# Patient Record
Sex: Female | Born: 1970 | Race: White | Hispanic: No | Marital: Married | Smoking: Never smoker
Health system: Southern US, Community
[De-identification: ages and names within clinical notes are randomized; demographics above are authoritative.]

## PROBLEM LIST (undated history)

## (undated) DIAGNOSIS — Z8489 Family history of other specified conditions: Secondary | ICD-10-CM

## (undated) DIAGNOSIS — C649 Malignant neoplasm of unspecified kidney, except renal pelvis: Secondary | ICD-10-CM

## (undated) DIAGNOSIS — D649 Anemia, unspecified: Secondary | ICD-10-CM

## (undated) DIAGNOSIS — E039 Hypothyroidism, unspecified: Secondary | ICD-10-CM

## (undated) DIAGNOSIS — J45909 Unspecified asthma, uncomplicated: Secondary | ICD-10-CM

## (undated) DIAGNOSIS — K219 Gastro-esophageal reflux disease without esophagitis: Secondary | ICD-10-CM

## (undated) DIAGNOSIS — E282 Polycystic ovarian syndrome: Secondary | ICD-10-CM

## (undated) HISTORY — PX: ABDOMINAL HYSTERECTOMY: SHX81

## (undated) HISTORY — PX: ELBOW SURGERY: SHX618

## (undated) HISTORY — PX: APPENDECTOMY: SHX54

## (undated) HISTORY — PX: CHOLECYSTECTOMY: SHX55

## (undated) HISTORY — PX: HEMORRHOID SURGERY: SHX153

## (undated) HISTORY — PX: KIDNEY SURGERY: SHX687

---

## 2004-04-26 ENCOUNTER — Observation Stay (HOSPITAL_COMMUNITY): Admission: RE | Admit: 2004-04-26 | Discharge: 2004-04-27 | Payer: Self-pay | Admitting: Obstetrics & Gynecology

## 2008-01-29 ENCOUNTER — Ambulatory Visit (HOSPITAL_COMMUNITY): Admission: RE | Admit: 2008-01-29 | Discharge: 2008-01-29 | Payer: Self-pay | Admitting: Preventative Medicine

## 2008-11-19 ENCOUNTER — Encounter: Admission: RE | Admit: 2008-11-19 | Discharge: 2008-11-19 | Payer: Self-pay | Admitting: Unknown Physician Specialty

## 2010-06-16 NOTE — Op Note (Signed)
[name], [name]                  ACCOUNT NO.:  [id number]   MEDICAL RECORD NO.:  [id number]          PATIENT TYPE:  AMB   LOCATION:  DAY                           FACILITY:  [facility]   PHYSICIAN:  Lazaro Arms, M.D.   DATE OF BIRTH:  27-Aug-1970   DATE OF PROCEDURE:  04/26/2004  DATE OF DISCHARGE:                                 OPERATIVE REPORT   PREOPERATIVE DIAGNOSES:  1.  Menometrorrhagia.  2.  Dysmenorrhea.  3.  Dyspareunia.   POSTOPERATIVE DIAGNOSES:  1.  Menometrorrhagia.  2.  Dysmenorrhea.  3.  Dyspareunia.   PROCEDURE:  Vaginal hysterectomy.   SURGEON:  Lazaro Arms, M.D.   ANESTHESIA:  General endotracheal.   FINDINGS:  The patient had what appeared to be a normal uterus.  Her tubes  and ovaries were also normal.  My suspicion was that she had adenomyosis.   DESCRIPTION OF OPERATION:  The patient was taken to the operating room and  placed in the supine position where she underwent general endotracheal  anesthesia.  She was then placed in dorsolithotomy position and prepped and  draped in the usual sterile fashion.  Marcaine 0.25% with 1:200,000  epinephrine was injected about the cervix.  Circumferential incision made  and the vagina was pushed off of the anterior and posterior ligament without  difficulty.  The uterosacral ligaments were clamped, cut and suture ligated.  The cardinal ligaments were clamped, cut and suture ligated.  The cardinal  ligaments were clamped, cut and suture ligated.  The anterior peritoneum was  entered.  The uterine vessels were clamped, cut and transfixion suture  ligated.  The serial pedicles were taken out the fundus of the uterus, each  pedicle being clamped, cut and suture ligated.  The utero-ovarian ligaments  were cross clamped.  A fore and aft suture was placed.  There was good  hemostasis of all pedicles.  The peritoneal cavity was  irrigated.  The ovaries were normal.  The perineum was closed in a  pursestring fashion.   The vagina was closed in interrupted fashion from side  to side.  The patient tolerated the procedure well.  She experienced 50 mL  of blood loss.  Taken to the recovery room in good stable condition.  All  counts were correct.      LHE/MEDQ  D:  04/26/2004  T:  04/26/2004  Job:  [id number]

## 2010-06-16 NOTE — H&P (Signed)
[name], [name]                  ACCOUNT NO.:  [id number]   MEDICAL RECORD NO.:  [id number]          PATIENT TYPE:  AMB   LOCATION:  DAY                           FACILITY:  [facility]   PHYSICIAN:  Lazaro Arms, M.D.   DATE OF BIRTH:  April 16, 1970   DATE OF ADMISSION:  DATE OF DISCHARGE:  LH                                HISTORY & PHYSICAL   HISTORY OF PRESENT ILLNESS:  Olshefski is a 40 year old white female gravida 1,  para 1.  She had a baby about 12 years ago, who has been seen in our office  now for several visits since last August complaining of very heavy and very  painful menstrual periods.  Patient was placed on Ovcon 35, Advil and Aleve  prophylactically, which really has not improved.  Additionally, she is  having pain with intercourse with deep thrusting.  I saw her originally on  January 26, 2004.  We did an ultrasound, which essentially revealed a  normal uterus and probably maybe some adenomyosis.  She did have pain with  palpation of the uterus.  At that time, I placed her on Megace, which has  stopped her bleeding.  I saw her back on March 03, 2004, after we had  talked significantly.  The patient decided that she wanted to proceed with  definitive therapy, prompted by pain with intercourse.  As a result, she is  admitted for a vaginal hysterectomy.   PAST MEDICAL HISTORY:  Negative.   PAST SURGICAL HISTORY:  She had a right ovarian cyst removed in 2000.   She is allergic to ASPIRIN.   PAST OB HISTORY:  She is gravida 1, para 1 with one vaginal delivery.   REVIEW OF SYSTEMS:  Otherwise negative.   PHYSICAL EXAMINATION:  VITAL SIGNS:  Her weight is 143 pounds.  Blood  pressure 100/72.  HEENT:  Unremarkable.  NECK:  Thyroid is normal.  LUNGS:  Clear.  HEART:  Regular rate and rhythm without murmurs, rubs or gallops.  BREASTS:  Without masses, discharge, skin changes.  ABDOMEN:  Benign.  No hepatosplenomegaly or mass.  GENITOURINARY:  She has normal external  genitalia.  The vagina is pink and  moist without discharge.  The cervix is parous, normal. The uterus is normal  in size, shape, and contour.  The ovaries are normal.   IMPRESSION:  1.  Menometrorrhagia.  2.  Dysmenorrhea.  3.  Dyspareunia.   PLAN:  Patient is admitted for vaginal hysterectomy.  She understands the  risks, benefits, indications, and alternatives for the procedure.      LHE/MEDQ  D:  04/25/2004  T:  04/25/2004  Job:  [id number]

## 2015-06-14 ENCOUNTER — Emergency Department (HOSPITAL_BASED_OUTPATIENT_CLINIC_OR_DEPARTMENT_OTHER): Payer: Self-pay

## 2015-06-14 ENCOUNTER — Encounter (HOSPITAL_BASED_OUTPATIENT_CLINIC_OR_DEPARTMENT_OTHER): Payer: Self-pay | Admitting: *Deleted

## 2015-06-14 ENCOUNTER — Emergency Department (HOSPITAL_BASED_OUTPATIENT_CLINIC_OR_DEPARTMENT_OTHER)
Admission: EM | Admit: 2015-06-14 | Discharge: 2015-06-15 | Disposition: A | Discharge status: Home / Self Care | Payer: Self-pay | Attending: Emergency Medicine | Admitting: Emergency Medicine

## 2015-06-14 DIAGNOSIS — Z85528 Personal history of other malignant neoplasm of kidney: Secondary | ICD-10-CM | POA: Insufficient documentation

## 2015-06-14 DIAGNOSIS — R110 Nausea: Secondary | ICD-10-CM | POA: Insufficient documentation

## 2015-06-14 DIAGNOSIS — M549 Dorsalgia, unspecified: Secondary | ICD-10-CM | POA: Insufficient documentation

## 2015-06-14 DIAGNOSIS — Y939 Activity, unspecified: Secondary | ICD-10-CM | POA: Insufficient documentation

## 2015-06-14 DIAGNOSIS — W19XXXA Unspecified fall, initial encounter: Secondary | ICD-10-CM

## 2015-06-14 DIAGNOSIS — S8012XA Contusion of left lower leg, initial encounter: Secondary | ICD-10-CM | POA: Insufficient documentation

## 2015-06-14 DIAGNOSIS — M7918 Myalgia, other site: Secondary | ICD-10-CM

## 2015-06-14 DIAGNOSIS — Y929 Unspecified place or not applicable: Secondary | ICD-10-CM | POA: Insufficient documentation

## 2015-06-14 DIAGNOSIS — W109XXA Fall (on) (from) unspecified stairs and steps, initial encounter: Secondary | ICD-10-CM | POA: Insufficient documentation

## 2015-06-14 DIAGNOSIS — Y999 Unspecified external cause status: Secondary | ICD-10-CM | POA: Insufficient documentation

## 2015-06-14 DIAGNOSIS — Z79899 Other long term (current) drug therapy: Secondary | ICD-10-CM | POA: Insufficient documentation

## 2015-06-14 DIAGNOSIS — S50311A Abrasion of right elbow, initial encounter: Secondary | ICD-10-CM | POA: Insufficient documentation

## 2015-06-14 HISTORY — DX: Malignant neoplasm of unspecified kidney, except renal pelvis: C64.9

## 2015-06-14 HISTORY — DX: Gastro-esophageal reflux disease without esophagitis: K21.9

## 2015-06-14 MED ORDER — IBUPROFEN 800 MG PO TABS
800.0000 mg | ORAL_TABLET | Freq: Once | ORAL | Status: AC
  Administered 2015-06-14: 800 mg via ORAL
  Filled 2015-06-14: qty 1

## 2015-06-14 MED ORDER — OXYCODONE HCL 5 MG PO TABS: 5.0000 mg | ORAL_TABLET | Freq: Four times a day (QID) | ORAL | Status: DC | PRN

## 2015-06-14 NOTE — ED Notes (Addendum)
Pt c/o fall up stairs x 1 hr ago injuring bil legs and right shoulder ETOH

## 2015-06-14 NOTE — ED Provider Notes (Signed)
CSN: [id number]     Arrival date & time 06/14/15  2155 History   First MD Initiated Contact with Patient 06/14/15 2206     Chief Complaint  Patient presents with  . Fall   (Consider location/radiation/quality/duration/timing/severity/associated sxs/prior Treatment) HPI 46 y.o. female presents to the Emergency Department today after a mechanical fall x 1 hour ago. Notes ambulating up the stairs and missing a step. Pt lost her footing and fell forward while sliding down 16 steps. No head trauma/LOC as patient was covering her head as she slid down. Noted BLE swelling over tibias with ecchymosis. Abrasion over right elbow. Notes pain is 8/10. Throbbing. Pt is able to ambulate, but with difficulty. Notes ETOH use at the time. No N/V. No chest pain. No shortness of breath. No numbness/tingling.   Past Medical History  Diagnosis Date  . GERD (gastroesophageal reflux disease)   . Renal cell cancer [facility])    Past Surgical History  Procedure Laterality Date  . Abdominal hysterectomy    . Appendectomy    . Cholecystectomy    . Kidney surgery     History reviewed. No pertinent family history. Social History  Substance Use Topics  . Smoking status: Never Smoker   . Smokeless tobacco: None  . Alcohol Use: No   OB History    No data available     Review of Systems  Cardiovascular: Negative for chest pain.  Gastrointestinal: Positive for nausea. Negative for vomiting.  Musculoskeletal: Positive for myalgias, back pain, joint swelling, arthralgias and neck pain.   Allergies  Review of patient's allergies indicates no known allergies.  Home Medications   Prior to Admission medications   Medication Sig Start Date End Date Taking? Authorizing Provider  omeprazole (PRILOSEC) 40 MG capsule Take 40 mg by mouth daily.   Yes Historical Provider, MD   BP 129/84 mmHg  Pulse 93  Temp(Src) 98.3 F (36.8 C)  Resp 18  Ht 5\' 5"  (1.651 m)  Wt 86.183 kg  BMI 31.62 kg/m2  SpO2 97%   Physical  Exam  Constitutional: She is oriented to person, place, and time. She appears well-developed and well-nourished.  HENT:  Head: Normocephalic and atraumatic.  Eyes: EOM are normal. Pupils are equal, round, and reactive to light.  Neck: Trachea normal, normal range of motion and phonation normal. Neck supple. Normal carotid pulses present. Spinous process tenderness (C7) present. No muscular tenderness present. Carotid bruit is not present. No rigidity. No erythema and normal range of motion present.  Cardiovascular: Normal rate, regular rhythm and normal heart sounds.   Pulmonary/Chest: Effort normal and breath sounds normal.  Abdominal: Soft.  Musculoskeletal:       Right shoulder: She exhibits tenderness and pain. She exhibits normal range of motion, no swelling and normal pulse.       Left shoulder: Normal.       Right elbow: She exhibits decreased range of motion. She exhibits no swelling and no deformity. Tenderness found. Medial epicondyle and lateral epicondyle tenderness noted.       Left elbow: Normal.       Right wrist: Normal.       Left wrist: Normal.       Right hip: Normal.       Left hip: Normal.       Right knee: Normal.       Left knee: Normal.       Right ankle: Normal.       Left ankle: Normal.  Cervical back: She exhibits tenderness, bony tenderness and pain. She exhibits normal range of motion, no swelling, no edema, no deformity and no laceration.       Thoracic back: Normal.       Lumbar back: Normal.  BLE swelling and ecchymosis over proximal tibias. Neurovascularly intact x 4 extremities. No pelvic instability. C spine around C7 TTP. Pt able to ambulate.    Neurological: She is alert and oriented to person, place, and time. She has normal strength. No cranial nerve deficit or sensory deficit.  Cranial Nerves:  II: Pupils equal, round, reactive to light III,IV, VI: ptosis not present, extra-ocular motions intact bilaterally  V,VII: smile symmetric, facial  light touch sensation equal VIII: hearing grossly normal bilaterally  IX,X: midline uvula rise  XI: bilateral shoulder shrug equal and strong XII: midline tongue extension  Skin: Skin is warm and dry.  Psychiatric: She has a normal mood and affect. Her behavior is normal. Thought content normal.  Nursing note and vitals reviewed.  ED Course  Procedures (including critical care time) Labs Review Labs Reviewed - No data to display  Imaging Review No results found. I have personally reviewed and evaluated these images and lab results as part of my medical decision-making.   EKG Interpretation None      MDM  I have reviewed and evaluated the relevant imaging studies.  I have reviewed the relevant previous healthcare records.I obtained HPI from historian.  ED Course:  Assessment: Pt is a 45yF who presents with s/p mechanical fall from stairs. On exam, pt in NAD. Nontoxic/nonseptic appearing. VSS. Afebrile. Lungs CTA. Heart RRR. Abdomen nontender soft. Noted ecchymosis and swelling over BLE proximal tibias. Compartments soft. Neurovascularly intact. Abrasion noted on posterior right elbow. ROM intact. All other extremities unremarkable. Pelvis stable. No chest tenderness. No head trauma or LOC. Noted C spine tenderness. Imaging with no acute abnormalities. Given analgesia in ED. Plan is to [place] with follow up to PCP. Musculoskeletal pain associated after fall. At time of discharge, Patient is in no acute distress. Vital Signs are stable. Patient is able to ambulate. Patient able to tolerate PO.    Disposition/Plan:  DC Home Additional Verbal discharge instructions given and discussed with patient.  Pt Instructed to f/u with PCP in the next week for evaluation and treatment of symptoms. Return precautions given Pt acknowledges and agrees with plan  Supervising Physician Blanchie Dessert, MD   Final diagnoses:  Fall  Musculoskeletal pain     Shary Decamp, PA-C 06/22/15  2306  Blanchie Dessert, MD 06/23/15 [phone]

## 2015-06-14 NOTE — Discharge Instructions (Signed)
Please read and follow all provided instructions.  Your diagnoses today include:  1. Musculoskeletal pain   2. Fall    Tests performed today include:  Vital signs. See below for your results today.   Medications prescribed:   None  Home care instructions:  Follow any educational materials contained in this packet.  You can use Ibuprofen 400mg  combined with Tylenol 1000mg  for pain relief every 6 hours. Do not exceed 4g of Tylenol in one 24 hour period.   Follow-up instructions: Please follow-up with your primary care provider in the next 48 hours for further evaluation of symptoms and treatment   Return instructions:   Please return to the Emergency Department if you do not get better, if you get worse, or new symptoms OR  - Fever (temperature greater than 101.38F)  - Bleeding that does not stop with holding pressure to the area    -Severe pain (please note that you may be more sore the day after your accident)  - Chest Pain  - Difficulty breathing  - Severe nausea or vomiting  - Inability to tolerate food and liquids  - Passing out  - Skin becoming red around your wounds  - Change in mental status (confusion or lethargy)  - New numbness or weakness     Please return if you have any other emergent concerns.  Additional Information:  Your vital signs today were: BP 129/84 mmHg   Pulse 93   Temp(Src) 98.3 F (36.8 C)   Resp 18   Ht 5\' 5"  (1.651 m)   Wt 86.183 kg   BMI 31.62 kg/m2   SpO2 97% If your blood pressure (BP) was elevated above 135/85 this visit, please have this repeated by your doctor within one month. ---------------

## 2015-06-14 NOTE — ED Notes (Signed)
Patient transported to X-ray 

## 2015-06-15 MED ORDER — OXYCODONE HCL 5 MG PO TABS
ORAL_TABLET | ORAL | Status: AC
  Filled 2015-06-15: qty 1

## 2015-06-15 MED ORDER — ONDANSETRON 4 MG PO TBDP
ORAL_TABLET | ORAL | Status: AC
  Filled 2015-06-15: qty 1

## 2015-06-19 ENCOUNTER — Emergency Department (HOSPITAL_BASED_OUTPATIENT_CLINIC_OR_DEPARTMENT_OTHER): Payer: Self-pay

## 2015-06-19 ENCOUNTER — Emergency Department (HOSPITAL_BASED_OUTPATIENT_CLINIC_OR_DEPARTMENT_OTHER)
Admission: EM | Admit: 2015-06-19 | Discharge: 2015-06-19 | Disposition: A | Discharge status: Home / Self Care | Payer: Self-pay | Attending: Emergency Medicine | Admitting: Emergency Medicine

## 2015-06-19 ENCOUNTER — Encounter (HOSPITAL_BASED_OUTPATIENT_CLINIC_OR_DEPARTMENT_OTHER): Payer: Self-pay

## 2015-06-19 DIAGNOSIS — R1011 Right upper quadrant pain: Secondary | ICD-10-CM | POA: Insufficient documentation

## 2015-06-19 DIAGNOSIS — S9002XA Contusion of left ankle, initial encounter: Secondary | ICD-10-CM | POA: Insufficient documentation

## 2015-06-19 DIAGNOSIS — M25571 Pain in right ankle and joints of right foot: Secondary | ICD-10-CM

## 2015-06-19 DIAGNOSIS — R52 Pain, unspecified: Secondary | ICD-10-CM

## 2015-06-19 DIAGNOSIS — R0781 Pleurodynia: Secondary | ICD-10-CM | POA: Insufficient documentation

## 2015-06-19 DIAGNOSIS — S9001XA Contusion of right ankle, initial encounter: Secondary | ICD-10-CM | POA: Insufficient documentation

## 2015-06-19 DIAGNOSIS — Z90710 Acquired absence of both cervix and uterus: Secondary | ICD-10-CM | POA: Insufficient documentation

## 2015-06-19 DIAGNOSIS — M25511 Pain in right shoulder: Secondary | ICD-10-CM | POA: Insufficient documentation

## 2015-06-19 DIAGNOSIS — R112 Nausea with vomiting, unspecified: Secondary | ICD-10-CM | POA: Insufficient documentation

## 2015-06-19 DIAGNOSIS — R51 Headache: Secondary | ICD-10-CM | POA: Insufficient documentation

## 2015-06-19 DIAGNOSIS — Z85528 Personal history of other malignant neoplasm of kidney: Secondary | ICD-10-CM | POA: Insufficient documentation

## 2015-06-19 DIAGNOSIS — M25572 Pain in left ankle and joints of left foot: Secondary | ICD-10-CM

## 2015-06-19 DIAGNOSIS — Y9301 Activity, walking, marching and hiking: Secondary | ICD-10-CM | POA: Insufficient documentation

## 2015-06-19 DIAGNOSIS — W109XXA Fall (on) (from) unspecified stairs and steps, initial encounter: Secondary | ICD-10-CM | POA: Insufficient documentation

## 2015-06-19 DIAGNOSIS — Y999 Unspecified external cause status: Secondary | ICD-10-CM | POA: Insufficient documentation

## 2015-06-19 DIAGNOSIS — Y929 Unspecified place or not applicable: Secondary | ICD-10-CM | POA: Insufficient documentation

## 2015-06-19 LAB — LIPASE, BLOOD: Lipase: 21 U/L (ref 11–51)

## 2015-06-19 LAB — COMPREHENSIVE METABOLIC PANEL
ALT: 18 U/L (ref 14–54)
AST: 23 U/L (ref 15–41)
Albumin: 4.4 g/dL (ref 3.5–5.0)
Alkaline Phosphatase: 82 U/L (ref 38–126)
Anion gap: 7 (ref 5–15)
BUN: 15 mg/dL (ref 6–20)
CO2: 26 mmol/L (ref 22–32)
Calcium: 9.6 mg/dL (ref 8.9–10.3)
Chloride: 104 mmol/L (ref 101–111)
Creatinine, Ser: 0.66 mg/dL (ref 0.44–1.00)
GFR calc Af Amer: >60 mL/min (ref >60)
GFR calc non Af Amer: >60 mL/min (ref >60)
Glucose, Bld: 99 mg/dL (ref 65–99)
Potassium: 4.2 mmol/L (ref 3.5–5.1)
Sodium: 137 mmol/L (ref 135–145)
Total Bilirubin: 0.9 mg/dL (ref 0.3–1.2)
Total Protein: 7.5 g/dL (ref 6.5–8.1)

## 2015-06-19 LAB — CK: Total CK: 59 U/L (ref 38–234)

## 2015-06-19 LAB — CBC WITH DIFFERENTIAL/PLATELET
Basophils Absolute: 0.1 10*3/uL (ref 0.0–0.1)
Basophils Relative: 1 %
Eosinophils Absolute: 0.1 10*3/uL (ref 0.0–0.7)
Eosinophils Relative: 2 %
HCT: 38.7 % (ref 36.0–46.0)
Hemoglobin: 13.2 g/dL (ref 12.0–15.0)
Lymphocytes Relative: 28 %
Lymphs Abs: 2.1 10*3/uL (ref 0.7–4.0)
MCH: 33.7 pg (ref 26.0–34.0)
MCHC: 34.1 g/dL (ref 30.0–36.0)
MCV: 98.7 fL (ref 78.0–100.0)
Monocytes Absolute: 0.5 10*3/uL (ref 0.1–1.0)
Monocytes Relative: 7 %
Neutro Abs: 4.5 10*3/uL (ref 1.7–7.7)
Neutrophils Relative %: 62 %
Platelets: 288 10*3/uL (ref 150–400)
RBC: 3.92 MIL/uL (ref 3.87–5.11)
RDW: 11.9 % (ref 11.5–15.5)
WBC: 7.4 10*3/uL (ref 4.0–10.5)

## 2015-06-19 LAB — URINALYSIS, ROUTINE W REFLEX MICROSCOPIC
BILIRUBIN URINE: NEGATIVE
GLUCOSE, UA: NEGATIVE mg/dL
HGB URINE DIPSTICK: NEGATIVE
KETONES UR: NEGATIVE mg/dL
LEUKOCYTES UA: NEGATIVE
Nitrite: NEGATIVE
PH: 7 (ref 5.0–8.0)
PROTEIN: NEGATIVE mg/dL
Specific Gravity, Urine: 1.008 (ref 1.005–1.030)

## 2015-06-19 LAB — PREGNANCY, URINE: Preg Test, Ur: NEGATIVE

## 2015-06-19 MED ORDER — IOPAMIDOL (ISOVUE-300) INJECTION 61%
80.0000 mL | Freq: Once | INTRAVENOUS | Status: AC | PRN
  Administered 2015-06-19: 80 mL via INTRAVENOUS

## 2015-06-19 MED ORDER — SODIUM CHLORIDE 0.9 % IV BOLUS (SEPSIS)
1000.0000 mL | Freq: Once | INTRAVENOUS | Status: AC
  Administered 2015-06-19: 1000 mL via INTRAVENOUS

## 2015-06-19 MED ORDER — ORPHENADRINE CITRATE ER 100 MG PO TB12: 100.0000 mg | ORAL_TABLET | Freq: Two times a day (BID) | ORAL | Status: DC

## 2015-06-19 MED ORDER — MORPHINE SULFATE (PF) 4 MG/ML IV SOLN
4.0000 mg | Freq: Once | INTRAVENOUS | Status: AC
  Administered 2015-06-19: 4 mg via INTRAVENOUS
  Filled 2015-06-19: qty 1

## 2015-06-19 MED ORDER — ONDANSETRON HCL 4 MG/2ML IJ SOLN
4.0000 mg | Freq: Once | INTRAMUSCULAR | Status: AC
  Administered 2015-06-19: 4 mg via INTRAVENOUS
  Filled 2015-06-19: qty 2

## 2015-06-19 MED ORDER — NAPROXEN 375 MG PO TABS: 375.0000 mg | ORAL_TABLET | Freq: Two times a day (BID) | ORAL | Status: DC

## 2015-06-19 MED ORDER — ONDANSETRON HCL 4 MG PO TABS: 4.0000 mg | ORAL_TABLET | Freq: Four times a day (QID) | ORAL | Status: DC

## 2015-06-19 NOTE — Discharge Instructions (Signed)
Medications: Naprosyn, Norflex, Zofran  Treatment: Take Naprosyn twice daily for your pain and inflammation. Take Norflex twice daily for your muscle soreness and pain. Take Zofran every 6 hours as needed for nausea and vomiting. Ice the areas of bruising and soreness 3-4 times daily alternating 20 minutes on, 20 minutes off.  Follow-up: Please follow-up with Dr. Debroah Loop office for further evaluation of shoulder pain. Please follow-up with [facility] for further evaluation of abdominal pain and nausea. Please follow-up with your primary care provider this week for follow-up of today's visit. Please return to emergency department if you develop any new or worsening symptoms.   Abdominal Pain, Adult Many things can cause abdominal pain. Usually, abdominal pain is not caused by a disease and will improve without treatment. It can often be observed and treated at home. Your health care provider will do a physical exam and possibly order blood tests and X-rays to help determine the seriousness of your pain. However, in many cases, more time must pass before a clear cause of the pain can be found. Before that point, your health care provider may not know if you need more testing or further treatment. HOME CARE INSTRUCTIONS Monitor your abdominal pain for any changes. The following actions may help to alleviate any discomfort you are experiencing:  Only take over-the-counter or prescription medicines as directed by your health care provider.  Do not take laxatives unless directed to do so by your health care provider.  Try a clear liquid diet (broth, tea, or water) as directed by your health care provider. Slowly move to a bland diet as tolerated. SEEK MEDICAL CARE IF:  You have unexplained abdominal pain.  You have abdominal pain associated with nausea or diarrhea.  You have pain when you urinate or have a bowel movement.  You experience abdominal pain that wakes you in the  night.  You have abdominal pain that is worsened or improved by eating food.  You have abdominal pain that is worsened with eating fatty foods.  You have a fever. SEEK IMMEDIATE MEDICAL CARE IF:  Your pain does not go away within 2 hours.  You keep throwing up (vomiting).  Your pain is felt only in portions of the abdomen, such as the right side or the left lower portion of the abdomen.  You pass bloody or black tarry stools. MAKE SURE YOU:  Understand these instructions.  Will watch your condition.  Will get help right away if you are not doing well or get worse.   This information is not intended to replace advice given to you by your health care provider. Make sure you discuss any questions you have with your health care provider.   Document Released: 10/25/2004 Document Revised: 10/06/2014 Document Reviewed: 09/24/2012 Elsevier Interactive Patient Education 2016 [employer]  Shoulder Pain The shoulder is the joint that connects your arms to your body. The bones that form the shoulder joint include the upper arm bone (humerus), the shoulder blade (scapula), and the collarbone (clavicle). The top of the humerus is shaped like a ball and fits into a rather flat socket on the scapula (glenoid cavity). A combination of muscles and strong, fibrous tissues that connect muscles to bones (tendons) support your shoulder joint and hold the ball in the socket. Small, fluid-filled sacs (bursae) are located in different areas of the joint. They act as cushions between the bones and the overlying soft tissues and help reduce friction between the gliding tendons and the bone as you  move your arm. Your shoulder joint allows a wide range of motion in your arm. This range of motion allows you to do things like scratch your back or throw a ball. However, this range of motion also makes your shoulder more prone to pain from overuse and injury. Causes of shoulder pain can originate from both injury  and overuse and usually can be grouped in the following four categories:  Redness, swelling, and pain (inflammation) of the tendon (tendinitis) or the bursae (bursitis).  Instability, such as a dislocation of the joint.  Inflammation of the joint (arthritis).  Broken bone (fracture). HOME CARE INSTRUCTIONS   Apply ice to the sore area.  Put ice in a plastic bag.  Place a towel between your skin and the bag.  Leave the ice on for 15-20 minutes, 3-4 times per day for the first 2 days, or as directed by your health care provider.  Stop using cold packs if they do not help with the pain.  If you have a shoulder sling or immobilizer, wear it as long as your caregiver instructs. Only remove it to shower or bathe. Move your arm as little as possible, but keep your hand moving to prevent swelling.  Squeeze a soft ball or foam pad as much as possible to help prevent swelling.  Only take over-the-counter or prescription medicines for pain, discomfort, or fever as directed by your caregiver. SEEK MEDICAL CARE IF:   Your shoulder pain increases, or new pain develops in your arm, hand, or fingers.  Your hand or fingers become cold and numb.  Your pain is not relieved with medicines. SEEK IMMEDIATE MEDICAL CARE IF:   Your arm, hand, or fingers are numb or tingling.  Your arm, hand, or fingers are significantly swollen or turn white or blue. MAKE SURE YOU:   Understand these instructions.  Will watch your condition.  Will get help right away if you are not doing well or get worse.   This information is not intended to replace advice given to you by your health care provider. Make sure you discuss any questions you have with your health care provider.   Document Released: 10/25/2004 Document Revised: 02/05/2014 Document Reviewed: 05/10/2014 Elsevier Interactive Patient Education [employer].

## 2015-06-19 NOTE — ED Provider Notes (Signed)
CSN: [id number]     Arrival date & time 06/19/15  1504 History  By signing my name below, I, Stephania Fragmin, attest that this documentation has been prepared under the direction and in the presence of Eliezer Mccoy, PA-C. Electronically Signed: Stephania Fragmin, ED Scribe. 06/19/2015. 5:16 PM.    Chief Complaint  Patient presents with  . Abdominal Pain   The history is provided by the patient. No language interpreter was used.    HPI Comments: [name] is a 45 y.o. female with a history of GERD and renal cell cancer, who presents to the Emergency Department s/p a fall that occurred 5 days ago, complaining of gradually worsening, constant, dull, stabbing, 8/10 right-sided abdominal pain that gradually developed days after her fall. Patient states she was walking upstairs while wearing slippers when she slipped and fell backwards, first landing on her right shoulder, and then tumbling down 16 steps. She states she is unsure whether she struck her head or lost consciousness but reports she tried to "tuck-and-roll" when she realized she was falling. Her husband states he ran to check on her and found her curled in a ball. Patient was seen in the ED the same day, and her chief complaint initially was bilateral leg pain. She had x-rays of her right elbow, bilateral tibia/fibula, and neck; all of which were negative, and was discharged with oxycodone. She notes an initial generalized headache that was alleviated with Aleve.   She states she was initially feeling sore all over and was not particularly concerned about her abdomen, but she states the abdominal pain that developed over the past few days feels different in quality from her initial soreness. She rates her pain as 8/10 presently. She also complains of right rib pain beneath her right breast that is worse with breathing, right arm pain radiating to her shoulder blade, nausea with 1 episode of vomiting while in the waiting room today, a dull headache located  in her bilateral temples, and continuing bilateral anterior lower leg and ankle pain. She also notes her fingernails have appeared more yellow than usual. Patient reports she had taken Aleve and Motrin, muscle relaxants, and oxycodone; all with partial relief to her pain. She states she has been icing her legs. Patient notes a history of abdominal hysterectomy, cholecystectomy, appendectomy, and partial nephrectomy. She denies dysuria.   Past Medical History  Diagnosis Date  . GERD (gastroesophageal reflux disease)   . Renal cell cancer [facility])    Past Surgical History  Procedure Laterality Date  . Abdominal hysterectomy    . Appendectomy    . Cholecystectomy    . Kidney surgery     No family history on file. Social History  Substance Use Topics  . Smoking status: Never Smoker   . Smokeless tobacco: None  . Alcohol Use: No   OB History    No data available     Review of Systems  Constitutional: Negative for fever and chills.  HENT: Negative for facial swelling and sore throat.   Respiratory: Negative for shortness of breath.   Cardiovascular: Positive for chest pain (right-sided rib pain).  Gastrointestinal: Positive for nausea, vomiting (one episode) and abdominal pain. Negative for diarrhea.  Genitourinary: Negative for dysuria.  Musculoskeletal: Positive for myalgias. Negative for back pain.  Skin: Negative for rash and wound.  Neurological: Positive for headaches.  Psychiatric/Behavioral: The patient is not nervous/anxious.   All other systems reviewed and are negative.  Allergies  Review of patient's allergies  indicates no known allergies.  Home Medications   Prior to Admission medications   Medication Sig Start Date End Date Taking? Authorizing Provider  naproxen (NAPROSYN) 375 MG tablet Take 1 tablet (375 mg total) by mouth 2 (two) times daily. 06/19/15   Frederica Kuster, PA-C  omeprazole (PRILOSEC) 40 MG capsule Take 40 mg by mouth daily.    Historical Provider, MD   ondansetron (ZOFRAN) 4 MG tablet Take 1 tablet (4 mg total) by mouth every 6 (six) hours. 06/19/15   Frederica Kuster, PA-C  orphenadrine (NORFLEX) 100 MG tablet Take 1 tablet (100 mg total) by mouth 2 (two) times daily. 06/19/15   Frederica Kuster, PA-C  oxyCODONE (ROXICODONE) 5 MG immediate release tablet Take 1 tablet (5 mg total) by mouth every 6 (six) hours as needed for severe pain. 06/14/15   Shary Decamp, PA-C   BP 123/81 mmHg  Pulse 79  Temp(Src) 98.3 F (36.8 C) (Oral)  Resp 16  Ht 5\' 5"  (1.651 m)  Wt 86.183 kg  BMI 31.62 kg/m2  SpO2 97% Physical Exam  Constitutional: She appears well-developed and well-nourished. No distress.  HENT:  Head: Normocephalic and atraumatic.  Mouth/Throat: Oropharynx is clear and moist. No oropharyngeal exudate.  Eyes: Conjunctivae are normal. Pupils are equal, round, and reactive to light. Right eye exhibits no discharge. Left eye exhibits no discharge. No scleral icterus.  Neck: Normal range of motion. Neck supple. No thyromegaly present.  Cardiovascular: Normal rate, regular rhythm, normal heart sounds and intact distal pulses.  Exam reveals no gallop and no friction rub.   No murmur heard. Pulmonary/Chest: Effort normal and breath sounds normal. No stridor. No respiratory distress. She has no wheezes. She has no rales.    Abdominal: Soft. Bowel sounds are normal. She exhibits no distension. There is tenderness in the right upper quadrant and epigastric area. There is no rebound and no guarding.    Musculoskeletal: She exhibits no edema.       Right shoulder: She exhibits tenderness, bony tenderness and pain. She exhibits normal range of motion and no deformity.       Right ankle: She exhibits ecchymosis. She exhibits normal range of motion.       Left ankle: She exhibits ecchymosis. She exhibits normal range of motion. Tenderness.       Cervical back: She exhibits no bony tenderness.       Thoracic back: She exhibits no bony tenderness.        Lumbar back: She exhibits no bony tenderness.       Arms:      Legs:      Feet:  Lymphadenopathy:    She has no cervical adenopathy.  Neurological: She is alert. Coordination normal.  CN 3-12 intact; normal sensation throughout; 5/5 strength in all 4 extremities; equal bilateral grip strength  Skin: Skin is warm and dry. No rash noted. She is not diaphoretic. No pallor.  Yellowing noted to bilateral finger nails, patient states this is new since yesterday  Psychiatric: She has a normal mood and affect.  Nursing note and vitals reviewed.   ED Course  Procedures (including critical care time)  DIAGNOSTIC STUDIES: Oxygen Saturation is 98% on RA, normal by my interpretation.    COORDINATION OF CARE: 5:11 PM - Discussed treatment plan with pt at bedside which includes diagnostic testing. Pt verbalized understanding and agreed to plan.   Labs Review Labs Reviewed  URINALYSIS, ROUTINE W REFLEX MICROSCOPIC (NOT AT [facility])  PREGNANCY, URINE  COMPREHENSIVE METABOLIC PANEL  CBC WITH DIFFERENTIAL/PLATELET  LIPASE, BLOOD  CK    Imaging Review Dg Chest 2 View  06/19/2015  CLINICAL DATA:  45 year old female with progressive right-sided chest and abdominal pain after falling down the stairs 5 days previously EXAM: CHEST  2 VIEW COMPARISON:  Concurrently obtained right-sided rib series FINDINGS: The lungs are clear and negative for focal airspace consolidation, pulmonary edema or suspicious pulmonary nodule. No pleural effusion or pneumothorax. Cardiac and mediastinal contours are within normal limits. No acute fracture or lytic or blastic osseous lesions. The visualized upper abdominal bowel gas pattern is unremarkable. IMPRESSION: Negative chest x-ray. Electronically Signed   By: Jacqulynn Cadet M.D.   On: 06/19/2015 19:50   Dg Ribs Unilateral Right  06/19/2015  CLINICAL DATA:  45 year old female with progressive right-sided rib and abdominal pain after falling down stairs 5 days previously  EXAM: RIGHT RIBS - 2 VIEW COMPARISON:  Concurrently obtained chest x-ray FINDINGS: No fracture or other bone lesions are seen involving the ribs. Mild dextro convex scoliosis of the lower thoracic spine centered at T10-T11. Surgical clips in a right upper quadrant suggest prior cholecystectomy. IMPRESSION: 1. No evidence of acute rib fracture. 2. Mild dextro convex scoliosis centered at T10-T11. Electronically Signed   By: Jacqulynn Cadet M.D.   On: 06/19/2015 19:51   Dg Ankle Complete Left  06/19/2015  CLINICAL DATA:  Pain after fall 5 days ago. EXAM: LEFT ANKLE COMPLETE - 3+ VIEW COMPARISON:  None. FINDINGS: There is no evidence of fracture, dislocation, or joint effusion. There is no evidence of arthropathy or other focal bone abnormality. Soft tissues are unremarkable. IMPRESSION: Negative. Electronically Signed   By: Dorise Bullion III M.D   On: 06/19/2015 18:27   Dg Ankle Complete Right  06/19/2015  CLINICAL DATA:  45 year old who fell down approximately 16 stairs 5 days ago, persistent bilateral ankle pain. Subsequent encounter. EXAM: RIGHT ANKLE - COMPLETE 3+ VIEW COMPARISON:  Right tibia-fibula x-rays 06/14/2015. FINDINGS: No evidence of acute fracture. Ankle mortise intact with well-preserved joint space. Well-preserved bone mineral density. No intrinsic osseous abnormalities. No visible joint effusion. IMPRESSION: Normal examination. Electronically Signed   By: Evangeline Dakin M.D.   On: 06/19/2015 18:28   Ct Head Wo Contrast  06/19/2015  CLINICAL DATA:  Status post fall down 16 steps 5 days ago, with loss of consciousness. Nausea, vomiting and headache. Initial encounter. EXAM: CT HEAD WITHOUT CONTRAST TECHNIQUE: Contiguous axial images were obtained from the base of the skull through the vertex without intravenous contrast. COMPARISON:  CT of the head and MRI of the brain performed 01/29/2008 FINDINGS: There is no evidence of acute infarction, mass lesion, or intra- or extra-axial  hemorrhage on CT. The posterior fossa, including the cerebellum, brainstem and fourth ventricle, is within normal limits. The third and lateral ventricles, and basal ganglia are unremarkable in appearance. The cerebral hemispheres are symmetric in appearance, with normal gray-white differentiation. No mass effect or midline shift is seen. There is no evidence of fracture; visualized osseous structures are unremarkable in appearance. The orbits are within normal limits. The paranasal sinuses and mastoid air cells are well-aerated. No significant soft tissue abnormalities are seen. IMPRESSION: No evidence of traumatic intracranial injury or fracture. Electronically Signed   By: Garald Balding M.D.   On: 06/19/2015 18:31   Ct Abdomen Pelvis W Contrast  06/19/2015  CLINICAL DATA:  Status post fall down stairs, with right-sided abdominal pain. Nausea and vomiting. Initial encounter. EXAM: CT ABDOMEN AND  PELVIS WITH CONTRAST TECHNIQUE: Multidetector CT imaging of the abdomen and pelvis was performed using the standard protocol following bolus administration of intravenous contrast. CONTRAST:  75mL ISOVUE-300 IOPAMIDOL (ISOVUE-300) INJECTION 61% COMPARISON:  Abdominal radiograph from 11/19/2008 FINDINGS: The visualized lung bases are clear. No free air or free fluid is seen within the abdomen or pelvis. There is no evidence of solid or hollow organ injury. The liver and spleen are unremarkable in appearance. The patient is status post cholecystectomy, with clips noted at the gallbladder fossa. The pancreas and adrenal glands are unremarkable. Postoperative change is noted at the right kidney, with scattered parenchymal calcification. A few small right renal stones are seen, measuring up to 4 mm in size. The left kidney is unremarkable in appearance. There is no evidence of hydronephrosis. No ureteral stones are seen. No perinephric stranding is appreciated. The small bowel is unremarkable in appearance. The stomach is  within normal limits. No acute vascular abnormalities are seen. The patient is status post appendectomy. The colon is unremarkable in appearance. The bladder is mildly distended and grossly unremarkable. The patient is status post hysterectomy. No suspicious adnexal masses are seen. No inguinal lymphadenopathy is seen. No acute osseous abnormalities are identified. IMPRESSION: 1. No evidence of traumatic injury to the abdomen or pelvis. 2. Postoperative change at the right kidney. Few small nonobstructing right renal stones seen, measuring up to 4 mm in size. Electronically Signed   By: Garald Balding M.D.   On: 06/19/2015 18:35   I have personally reviewed and evaluated these images and lab results as part of my medical decision-making.   EKG Interpretation None      MDM   Patient pain and nausea medication ED. CBC, CMP, lipase, CK, UA all within normal limits. Negative chest x-ray. No evidence of acute right rib fracture. Negative bilateral ankle x-rays. CT head shows no evidence of traumatic intracranial injury or fracture. CT abdomen and pelvis with contrast shows no evidence of traumatic injury to the abdomen or pelvis. Patient to follow-up with GI and orthopedics for further evaluation. Patient discharged with naproxen, Norflex, and Zofran. Patient also evaluated by Dr. Vallery Ridge who is in agreement with plan. Patient vitals stable throughout ED course and discharged in satisfactory condition. Patient able to ambulate in ED.  Final diagnoses:  Right shoulder pain  Right upper quadrant abdominal pain  Arthralgia of both ankles  Non-intractable vomiting with nausea, vomiting of unspecified type    I personally performed the services described in this documentation, which was scribed in my presence. The recorded information has been reviewed and is accurate.     Frederica Kuster, PA-C 06/20/15 0149  Charlesetta Shanks, MD 06/23/15 [phone]

## 2015-06-19 NOTE — ED Notes (Signed)
Pt reports fall on Tuesday. Reports increased pain in right upper quadrant and right shoulder pain. Seen at [facility] center and sent for ?possible internal bleeding.

## 2018-01-01 IMAGING — CR DG RIBS 2V*R*
3 series · 3 of 3 positions shown · non-contrast
Comparison: Concurrently obtained chest x-ray

CLINICAL DATA: 45-year-old female with progressive right-sided rib
and abdominal pain after falling down stairs 5 days previously

EXAM:
RIGHT RIBS - 2 VIEW

[w ribs ap/pa upper right]
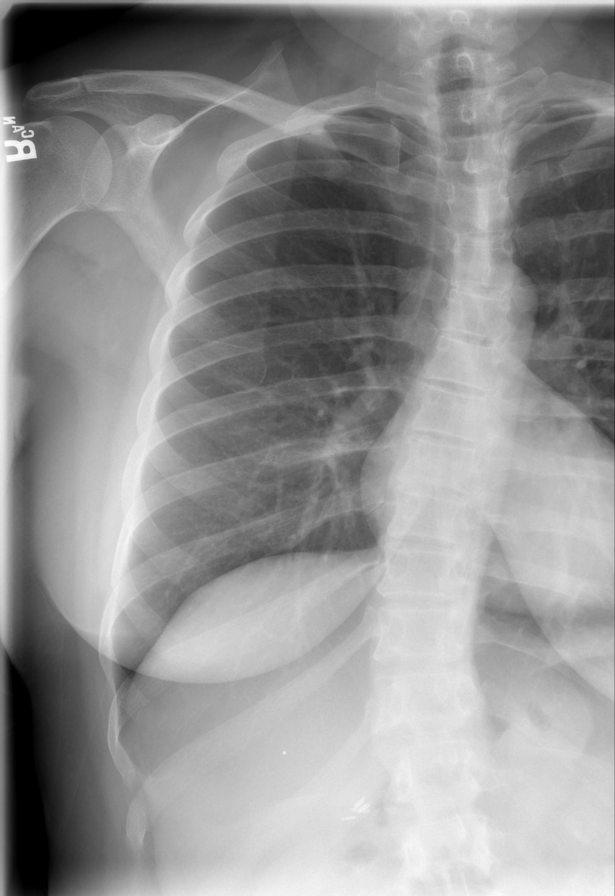

[w ribs oblique right]
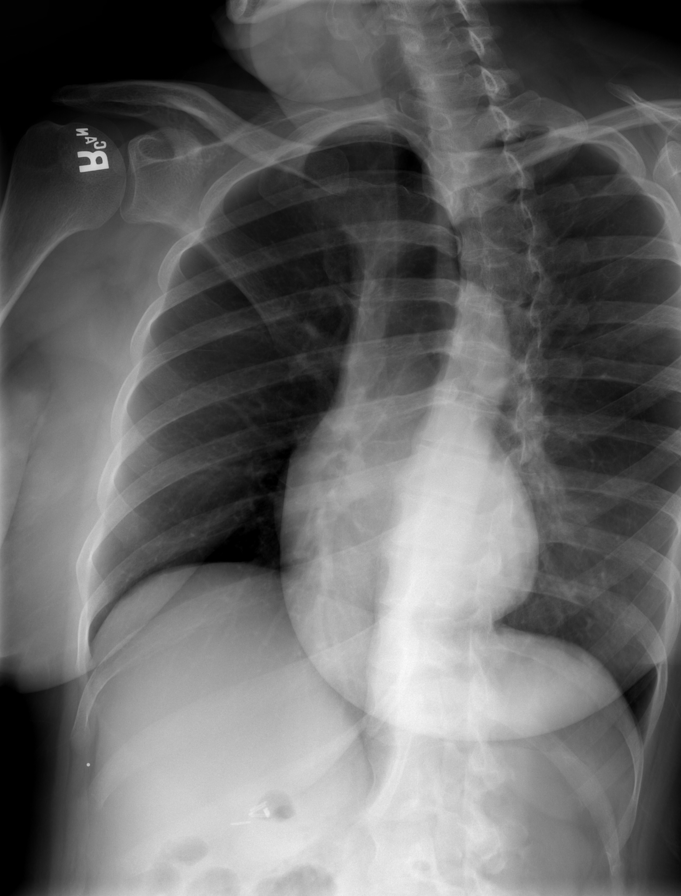

[w ribs ap/pa lower right]
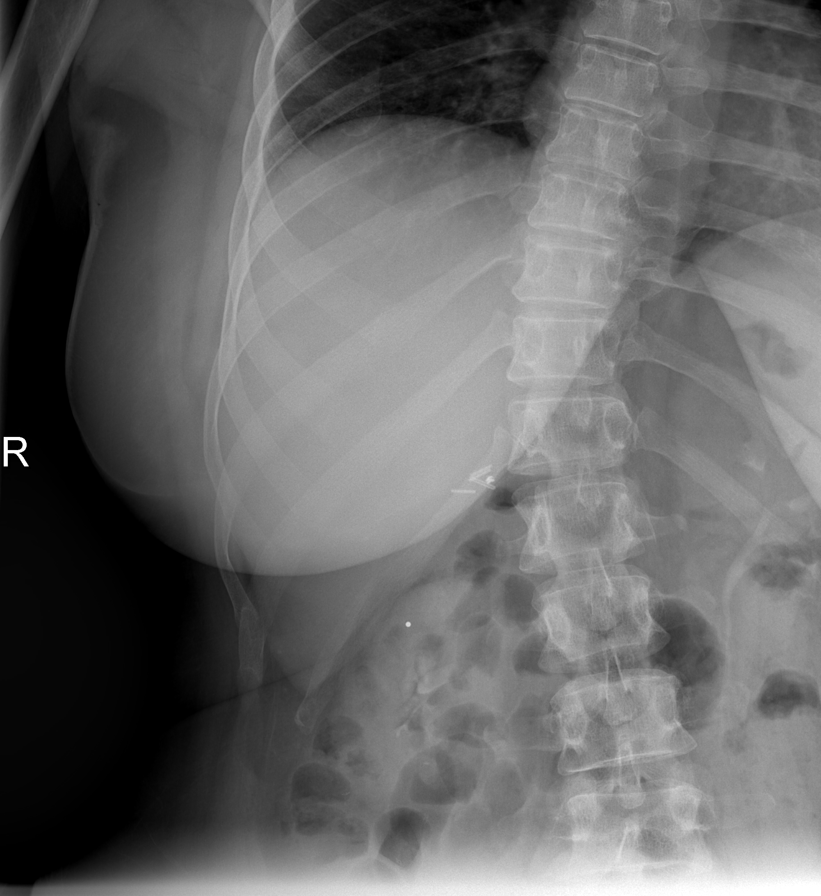

[3 of 3 positions shown; findings below may reference images not displayed]

FINDINGS: No fracture or other bone lesions are seen involving the ribs. Mild
dextro convex scoliosis of the lower thoracic spine centered at
T10-T11. Surgical clips in a right upper quadrant suggest prior
cholecystectomy.
IMPRESSION: 1. No evidence of acute rib fracture.
2. Mild dextro convex scoliosis centered at T10-T11.

## 2019-06-11 ENCOUNTER — Encounter (HOSPITAL_BASED_OUTPATIENT_CLINIC_OR_DEPARTMENT_OTHER): Payer: Self-pay | Admitting: *Deleted

## 2019-06-11 ENCOUNTER — Emergency Department (HOSPITAL_BASED_OUTPATIENT_CLINIC_OR_DEPARTMENT_OTHER): Payer: Self-pay

## 2019-06-11 ENCOUNTER — Other Ambulatory Visit: Payer: Self-pay

## 2019-06-11 ENCOUNTER — Emergency Department (HOSPITAL_BASED_OUTPATIENT_CLINIC_OR_DEPARTMENT_OTHER)
Admission: EM | Admit: 2019-06-11 | Discharge: 2019-06-12 | Disposition: A | Discharge status: Home / Self Care | Payer: Self-pay | Attending: Emergency Medicine | Admitting: Emergency Medicine

## 2019-06-11 DIAGNOSIS — Z79899 Other long term (current) drug therapy: Secondary | ICD-10-CM | POA: Insufficient documentation

## 2019-06-11 DIAGNOSIS — Z7984 Long term (current) use of oral hypoglycemic drugs: Secondary | ICD-10-CM | POA: Insufficient documentation

## 2019-06-11 DIAGNOSIS — K5792 Diverticulitis of intestine, part unspecified, without perforation or abscess without bleeding: Secondary | ICD-10-CM | POA: Insufficient documentation

## 2019-06-11 DIAGNOSIS — Z85528 Personal history of other malignant neoplasm of kidney: Secondary | ICD-10-CM | POA: Insufficient documentation

## 2019-06-11 LAB — COMPREHENSIVE METABOLIC PANEL WITH GFR
ALT: 21 U/L (ref 0–44)
AST: 18 U/L (ref 15–41)
Albumin: 4.7 g/dL (ref 3.5–5.0)
Alkaline Phosphatase: 109 U/L (ref 38–126)
Anion gap: 13 (ref 5–15)
BUN: 10 mg/dL (ref 6–20)
CO2: 24 mmol/L (ref 22–32)
Calcium: 9.9 mg/dL (ref 8.9–10.3)
Chloride: 96 mmol/L — ABNORMAL LOW (ref 98–111)
Creatinine, Ser: 0.66 mg/dL (ref 0.44–1.00)
GFR calc Af Amer: >60 mL/min (ref >60)
GFR calc non Af Amer: >60 mL/min (ref >60)
Glucose, Bld: 106 mg/dL — ABNORMAL HIGH (ref 70–99)
Potassium: 4.4 mmol/L (ref 3.5–5.1)
Sodium: 133 mmol/L — ABNORMAL LOW (ref 135–145)
Total Bilirubin: 0.9 mg/dL (ref 0.3–1.2)
Total Protein: 8.4 g/dL — ABNORMAL HIGH (ref 6.5–8.1)

## 2019-06-11 LAB — URINALYSIS, ROUTINE W REFLEX MICROSCOPIC
Bilirubin Urine: NEGATIVE
Glucose, UA: NEGATIVE mg/dL
Hgb urine dipstick: NEGATIVE
Ketones, ur: NEGATIVE mg/dL
Leukocytes,Ua: NEGATIVE
Nitrite: NEGATIVE
Protein, ur: NEGATIVE mg/dL
Specific Gravity, Urine: 1.01 (ref 1.005–1.030)
pH: 6.5 (ref 5.0–8.0)

## 2019-06-11 LAB — LIPASE, BLOOD: Lipase: 21 U/L (ref 11–51)

## 2019-06-11 LAB — CBC
HCT: 43.1 % (ref 36.0–46.0)
Hemoglobin: 14.7 g/dL (ref 12.0–15.0)
MCH: 32.8 pg (ref 26.0–34.0)
MCHC: 34.1 g/dL (ref 30.0–36.0)
MCV: 96.2 fL (ref 80.0–100.0)
Platelets: 334 10*3/uL (ref 150–400)
RBC: 4.48 MIL/uL (ref 3.87–5.11)
RDW: 11.6 % (ref 11.5–15.5)
WBC: 12.7 10*3/uL — ABNORMAL HIGH (ref 4.0–10.5)
nRBC: 0 % (ref 0.0–0.2)

## 2019-06-11 LAB — PREGNANCY, URINE: Preg Test, Ur: NEGATIVE

## 2019-06-11 MED ORDER — SODIUM CHLORIDE 0.9% FLUSH
3.0000 mL | Freq: Once | INTRAVENOUS | Status: DC
  Filled 2019-06-11: qty 3

## 2019-06-11 NOTE — ED Triage Notes (Signed)
Abdominal pain. Lower left quadrant. Body aches x 4 days. Nausea. No vomiting. Constipated.

## 2019-06-12 ENCOUNTER — Encounter (HOSPITAL_BASED_OUTPATIENT_CLINIC_OR_DEPARTMENT_OTHER): Payer: Self-pay | Admitting: Emergency Medicine

## 2019-06-12 MED ORDER — DICLOFENAC SODIUM ER 100 MG PO TB24: 100.0000 mg | ORAL_TABLET | Freq: Every day | ORAL | 0 refills | Status: DC

## 2019-06-12 MED ORDER — IOHEXOL 300 MG/ML  SOLN
100.0000 mL | Freq: Once | INTRAMUSCULAR | Status: AC | PRN
  Administered 2019-06-12: 100 mL via INTRAVENOUS

## 2019-06-12 MED ORDER — ONDANSETRON 8 MG PO TBDP
ORAL_TABLET | ORAL | 0 refills | Status: DC
Start: 2019-06-12 — End: 2019-09-29

## 2019-06-12 MED ORDER — TRAMADOL HCL 50 MG PO TABS
50.0000 mg | ORAL_TABLET | Freq: Once | ORAL | Status: AC
  Administered 2019-06-12: 50 mg via ORAL

## 2019-06-12 MED ORDER — KETOROLAC TROMETHAMINE 15 MG/ML IJ SOLN
15.0000 mg | Freq: Once | INTRAMUSCULAR | Status: AC
  Administered 2019-06-12: 15 mg via INTRAVENOUS
  Filled 2019-06-12: qty 1

## 2019-06-12 MED ORDER — AMOXICILLIN-POT CLAVULANATE 875-125 MG PO TABS
1.0000 | ORAL_TABLET | Freq: Two times a day (BID) | ORAL | 0 refills | Status: DC
Start: 2019-06-12 — End: 2019-10-09

## 2019-06-12 MED ORDER — AMOXICILLIN-POT CLAVULANATE 875-125 MG PO TABS
ORAL_TABLET | ORAL | Status: AC
  Filled 2019-06-12: qty 1

## 2019-06-12 MED ORDER — ONDANSETRON HCL 4 MG/2ML IJ SOLN
4.0000 mg | Freq: Once | INTRAMUSCULAR | Status: AC
  Administered 2019-06-12: 4 mg via INTRAVENOUS
  Filled 2019-06-12: qty 2

## 2019-06-12 MED ORDER — ONDANSETRON 4 MG PO TBDP
8.0000 mg | ORAL_TABLET | Freq: Once | ORAL | Status: AC
  Administered 2019-06-12: 8 mg via ORAL
  Filled 2019-06-12: qty 2

## 2019-06-12 MED ORDER — TRAMADOL HCL 50 MG PO TABS
ORAL_TABLET | ORAL | Status: AC
  Filled 2019-06-12: qty 1

## 2019-06-12 MED ORDER — AMOXICILLIN-POT CLAVULANATE 875-125 MG PO TABS
1.0000 | ORAL_TABLET | Freq: Once | ORAL | Status: AC
  Administered 2019-06-12: 1 via ORAL

## 2019-06-12 NOTE — ED Provider Notes (Signed)
[facility]GENCY DEPARTMENT Provider Note   CSN: [id number] Arrival date & time: 06/11/19  2155     History Chief Complaint  Patient presents with  . Abdominal Pain    [name] is a 49 y.o. female.  The history is provided by the patient.  Abdominal Pain Pain location:  LLQ Pain quality: aching   Pain radiates to:  Does not radiate Pain severity:  Severe Onset quality:  Gradual Duration:  4 days Timing:  Constant Progression:  Worsening Chronicity:  New Context: not sick contacts and not suspicious food intake   Relieved by:  Nothing Worsened by:  Nothing Ineffective treatments:  OTC medications (tylenol) Associated symptoms: nausea   Associated symptoms: no anorexia, no belching, no chest pain, no chills, no constipation, no cough, no diarrhea, no dysuria, no fever, no flatus, no melena, no shortness of breath and no vomiting   Risk factors: not elderly        Past Medical History:  Diagnosis Date  . GERD (gastroesophageal reflux disease)   . Renal cell cancer (HCC)     There are no problems to display for this patient.   Past Surgical History:  Procedure Laterality Date  . ABDOMINAL HYSTERECTOMY    . APPENDECTOMY    . CHOLECYSTECTOMY    . KIDNEY SURGERY       OB History   No obstetric history on file.     History reviewed. No pertinent family history.  Social History   Tobacco Use  . Smoking status: Never Smoker  . Smokeless tobacco: Never Used  Substance Use Topics  . Alcohol use: No  . Drug use: No    Home Medications Prior to Admission medications   Medication Sig Start Date End Date Taking? Authorizing Provider  metFORMIN (GLUCOPHAGE) 500 MG tablet Take by mouth 2 (two) times daily with a meal.   Yes [provider]  spironolactone (ALDACTONE) 25 MG tablet Take 25 mg by mouth daily.   Yes [provider]  amoxicillin-clavulanate (AUGMENTIN) 875-125 MG tablet Take 1 tablet by mouth 2 (two) times  daily. One po bid x 7 days 06/12/19   Sharyn Brilliant, MD  Diclofenac Sodium CR 100 MG 24 hr tablet Take 1 tablet (100 mg total) by mouth daily. 06/12/19   Ervey Fallin, MD  montelukast (SINGULAIR) 10 MG tablet Take 10 mg by mouth daily. 04/21/19   [provider]  naproxen (NAPROSYN) 375 MG tablet Take 1 tablet (375 mg total) by mouth 2 (two) times daily. 06/19/15   Law, Bea Graff, PA-C  omeprazole (PRILOSEC) 40 MG capsule Take 40 mg by mouth daily.    [provider]  ondansetron (ZOFRAN ODT) 8 MG disintegrating tablet 8mg  ODT q8 hours prn nausea 06/12/19   Kailen Name, MD  ondansetron (ZOFRAN) 4 MG tablet Take 1 tablet (4 mg total) by mouth every 6 (six) hours. 06/19/15   Law, Bea Graff, PA-C  orphenadrine (NORFLEX) 100 MG tablet Take 1 tablet (100 mg total) by mouth 2 (two) times daily. 06/19/15   Law, Bea Graff, PA-C  oxyCODONE (ROXICODONE) 5 MG immediate release tablet Take 1 tablet (5 mg total) by mouth every 6 (six) hours as needed for severe pain. 06/14/15   Shary Decamp, PA-C  PROGESTERONE PO by Does not apply route.    [provider]    Allergies    Patient has no known allergies.  Review of Systems   Review of Systems  Constitutional: Negative for chills  and fever.  HENT: Negative for congestion.   Eyes: Negative for visual disturbance.  Respiratory: Negative for cough and shortness of breath.   Cardiovascular: Negative for chest pain.  Gastrointestinal: Positive for abdominal pain and nausea. Negative for anorexia, constipation, diarrhea, flatus, melena and vomiting.  Genitourinary: Negative for dysuria.  Musculoskeletal: Negative for arthralgias.  Neurological: Negative for dizziness.  Psychiatric/Behavioral: Negative for agitation.  All other systems reviewed and are negative.   Physical Exam Updated Vital Signs BP 125/76 (BP Location: Right Arm)   Pulse (!) 105   Temp 98.8 F (37.1 C)   Resp 15   Ht 5\' 5"  (1.651 m)   Wt 81.2 kg    SpO2 97%   BMI 29.79 kg/m   Physical Exam Vitals and nursing note reviewed.  Constitutional:      Appearance: Normal appearance. She is not ill-appearing.  HENT:     Head: Normocephalic and atraumatic.     Nose: Nose normal.     Mouth/Throat:     Mouth: Mucous membranes are moist.     Pharynx: Oropharynx is clear.  Eyes:     Pupils: Pupils are equal, round, and reactive to light.  Cardiovascular:     Rate and Rhythm: Normal rate and regular rhythm.     Pulses: Normal pulses.     Heart sounds: Normal heart sounds.  Pulmonary:     Effort: Pulmonary effort is normal.     Breath sounds: Normal breath sounds.  Abdominal:     General: Abdomen is flat. Bowel sounds are normal.     Palpations: Abdomen is soft.     Tenderness: There is abdominal tenderness. There is no guarding or rebound.     Comments: LLQ  Musculoskeletal:        General: Normal range of motion.     Cervical back: Normal range of motion and neck supple.  Skin:    General: Skin is warm and dry.     Capillary Refill: Capillary refill takes less than 2 seconds.  Neurological:     General: No focal deficit present.     Mental Status: She is alert and oriented to person, place, and time.     Deep Tendon Reflexes: Reflexes normal.  Psychiatric:        Mood and Affect: Mood normal.        Behavior: Behavior normal.     ED Results / Procedures / Treatments   Labs (all labs ordered are listed, but only abnormal results are displayed) Results for orders placed or performed during the hospital encounter of 06/11/19  Lipase, blood  Result Value Ref Range   Lipase 21 11 - 51 U/L  Comprehensive metabolic panel  Result Value Ref Range   Sodium 133 (L) 135 - 145 mmol/L   Potassium 4.4 3.5 - 5.1 mmol/L   Chloride 96 (L) 98 - 111 mmol/L   CO2 24 22 - 32 mmol/L   Glucose, Bld 106 (H) 70 - 99 mg/dL   BUN 10 6 - 20 mg/dL   Creatinine, Ser 0.66 0.44 - 1.00 mg/dL   Calcium 9.9 8.9 - 10.3 mg/dL   Total Protein 8.4 (H)  6.5 - 8.1 g/dL   Albumin 4.7 3.5 - 5.0 g/dL   AST 18 15 - 41 U/L   ALT 21 0 - 44 U/L   Alkaline Phosphatase 109 38 - 126 U/L   Total Bilirubin 0.9 0.3 - 1.2 mg/dL   GFR calc non Af Amer >60 >60 mL/min  GFR calc Af Amer >60 >60 mL/min   Anion gap 13 5 - 15  CBC  Result Value Ref Range   WBC 12.7 (H) 4.0 - 10.5 K/uL   RBC 4.48 3.87 - 5.11 MIL/uL   Hemoglobin 14.7 12.0 - 15.0 g/dL   HCT 43.1 36.0 - 46.0 %   MCV 96.2 80.0 - 100.0 fL   MCH 32.8 26.0 - 34.0 pg   MCHC 34.1 30.0 - 36.0 g/dL   RDW 11.6 11.5 - 15.5 %   Platelets 334 150 - 400 K/uL   nRBC 0.0 0.0 - 0.2 %  Urinalysis, Routine w reflex microscopic  Result Value Ref Range   Color, Urine YELLOW YELLOW   APPearance CLEAR CLEAR   Specific Gravity, Urine 1.010 1.005 - 1.030   pH 6.5 5.0 - 8.0   Glucose, UA NEGATIVE NEGATIVE mg/dL   Hgb urine dipstick NEGATIVE NEGATIVE   Bilirubin Urine NEGATIVE NEGATIVE   Ketones, ur NEGATIVE NEGATIVE mg/dL   Protein, ur NEGATIVE NEGATIVE mg/dL   Nitrite NEGATIVE NEGATIVE   Leukocytes,Ua NEGATIVE NEGATIVE  Pregnancy, urine  Result Value Ref Range   Preg Test, Ur NEGATIVE NEGATIVE   CT ABDOMEN PELVIS W CONTRAST  Result Date: 06/12/2019 CLINICAL DATA:  Left lower quadrant pain and body aches EXAM: CT ABDOMEN AND PELVIS WITH CONTRAST TECHNIQUE: Multidetector CT imaging of the abdomen and pelvis was performed using the standard protocol following bolus administration of intravenous contrast. CONTRAST:  157mL OMNIPAQUE IOHEXOL 300 MG/ML  SOLN COMPARISON:  Jun 19, 2015 FINDINGS: Lower chest: The visualized heart size within normal limits. No pericardial fluid/thickening. No hiatal hernia. The visualized portions of the lungs are clear. Hepatobiliary: The liver is normal in density without focal abnormality.The main portal vein is patent. The patient is status post cholecystectomy. No biliary ductal dilation. Pancreas: Unremarkable. No pancreatic ductal dilatation or surrounding inflammatory changes.  Spleen: Normal in size without focal abnormality. Adrenals/Urinary Tract: Both adrenal glands appear normal. Again noted are postsurgical changes from a partial nephrectomy of the lower pole the right kidney with coarse calcifications. The left kidney is unremarkable. The bladder is normal appearance. Stomach/Bowel: The stomach, small bowel, are normal in appearance. The patient is status post appendectomy. There is a focal 4 cm segment of sigmoid colon with diffuse wall thickening, scattered colonic diverticula and surrounding significant fat stranding changes. No definite surrounding free air or loculated fluid collections however are noted. Vascular/Lymphatic: There are no enlarged mesenteric, retroperitoneal, or pelvic lymph nodes. No significant vascular findings are present. Reproductive: The patient is status post hysterectomy. No adnexal masses or collections seen. Other: No evidence of abdominal wall mass or hernia. Musculoskeletal: No acute or significant osseous findings. IMPRESSION: Findings consistent with focal sigmoid colonic diverticulitis. No surrounding free air or loculated fluid collections. Electronically Signed   By: Prudencio Pair M.D.   On: 06/12/2019 00:47    Radiology CT ABDOMEN PELVIS W CONTRAST  Result Date: 06/12/2019 CLINICAL DATA:  Left lower quadrant pain and body aches EXAM: CT ABDOMEN AND PELVIS WITH CONTRAST TECHNIQUE: Multidetector CT imaging of the abdomen and pelvis was performed using the standard protocol following bolus administration of intravenous contrast. CONTRAST:  131mL OMNIPAQUE IOHEXOL 300 MG/ML  SOLN COMPARISON:  Jun 19, 2015 FINDINGS: Lower chest: The visualized heart size within normal limits. No pericardial fluid/thickening. No hiatal hernia. The visualized portions of the lungs are clear. Hepatobiliary: The liver is normal in density without focal abnormality.The main portal vein is patent. The patient is status  post cholecystectomy. No biliary ductal  dilation. Pancreas: Unremarkable. No pancreatic ductal dilatation or surrounding inflammatory changes. Spleen: Normal in size without focal abnormality. Adrenals/Urinary Tract: Both adrenal glands appear normal. Again noted are postsurgical changes from a partial nephrectomy of the lower pole the right kidney with coarse calcifications. The left kidney is unremarkable. The bladder is normal appearance. Stomach/Bowel: The stomach, small bowel, are normal in appearance. The patient is status post appendectomy. There is a focal 4 cm segment of sigmoid colon with diffuse wall thickening, scattered colonic diverticula and surrounding significant fat stranding changes. No definite surrounding free air or loculated fluid collections however are noted. Vascular/Lymphatic: There are no enlarged mesenteric, retroperitoneal, or pelvic lymph nodes. No significant vascular findings are present. Reproductive: The patient is status post hysterectomy. No adnexal masses or collections seen. Other: No evidence of abdominal wall mass or hernia. Musculoskeletal: No acute or significant osseous findings. IMPRESSION: Findings consistent with focal sigmoid colonic diverticulitis. No surrounding free air or loculated fluid collections. Electronically Signed   By: Prudencio Pair M.D.   On: 06/12/2019 00:47    Procedures Procedures (including critical care time)  Medications Ordered in ED Medications  sodium chloride flush (NS) 0.9 % injection 3 mL (3 mLs Intravenous Not Given 06/11/19 2327)  traMADol (ULTRAM) 50 MG tablet (has no administration in time range)  amoxicillin-clavulanate (AUGMENTIN) 875-125 MG per tablet (has no administration in time range)  ondansetron (ZOFRAN) injection 4 mg (4 mg Intravenous Given 06/12/19 0009)  ketorolac (TORADOL) 15 MG/ML injection 15 mg (15 mg Intravenous Given 06/12/19 0009)  iohexol (OMNIPAQUE) 300 MG/ML solution 100 mL (100 mLs Intravenous Contrast Given 06/12/19 0024)  traMADol (ULTRAM)  tablet 50 mg (50 mg Oral Given 06/12/19 0108)  amoxicillin-clavulanate (AUGMENTIN) 875-125 MG per tablet 1 tablet (1 tablet Oral Given 06/12/19 0108)  ondansetron (ZOFRAN-ODT) disintegrating tablet 8 mg (8 mg Oral Given 06/12/19 0127)    ED Course  I have reviewed the triage vital signs and the nursing notes.  Pertinent labs & imaging results that were available during my care of the patient were reviewed by me and considered in my medical decision making (see chart for details).    Exam and CT consistent with diverticulitis.  No abscess, no perforation.  Patient is hemodyamically stable.  Patient is staking POs.  Will start PO antibiotics and pain medication.  Close follow up with PMD.  Strict return precautions given.   [name] was evaluated in Emergency Department on 06/12/2019 for the symptoms described in the history of present illness. She was evaluated in the context of the global COVID-19 pandemic, which necessitated consideration that the patient might be at risk for infection with the SARS-CoV-2 virus that causes COVID-19. Institutional protocols and algorithms that pertain to the evaluation of patients at risk for COVID-19 are in a state of rapid change based on information released by regulatory bodies including the CDC and federal and state organizations. These policies and algorithms were followed during the patient's care in the ED.  Final Clinical Impression(s) / ED Diagnoses Final diagnoses:  Diverticulitis   Return for intractable cough, coughing up blood,fevers >100.4 unrelieved by medication, shortness of breath, intractable vomiting, chest pain, shortness of breath, weakness,numbness, changes in speech, facial asymmetry,abdominal pain, passing out,Inability to tolerate liquids or food, cough, altered mental status or any concerns. No signs of systemic illness or infection. The patient is nontoxic-appearing on exam and vital signs are within normal limits.   I  have reviewed the triage  vital signs and the nursing notes. Pertinent labs &imaging results that were available during my care of the patient were reviewed by me and considered in my medical decision making (see chart for details).After history, exam, and medical workup I feel the patient has beenappropriately medically screened and is safe for discharge home. Pertinent diagnoses were discussed with the patient. Patient was given return precautions.    Rx / DC Orders ED Discharge Orders         Ordered    Diclofenac Sodium CR 100 MG 24 hr tablet  Daily     06/12/19 0057    amoxicillin-clavulanate (AUGMENTIN) 875-125 MG tablet  2 times daily     06/12/19 0057    ondansetron (ZOFRAN ODT) 8 MG disintegrating tablet     06/12/19 0104           Audriella Blakeley, MD 06/12/19 0151

## 2019-09-29 ENCOUNTER — Other Ambulatory Visit: Payer: Self-pay | Admitting: Urology

## 2019-09-30 NOTE — Progress Notes (Signed)
DUE TO COVID-19 ONLY ONE VISITOR IS ALLOWED TO COME WITH YOU AND STAY IN THE WAITING ROOM ONLY DURING PRE OP AND PROCEDURE DAY OF SURGERY. THE 1 VISITOR  MAY VISIT WITH YOU AFTER SURGERY IN YOUR PRIVATE ROOM DURING VISITING HOURS ONLY!  YOU NEED TO HAVE A COVID 19 TEST ON_9/07/2019 ______ @_______ , THIS TEST MUST BE DONE BEFORE SURGERY,  COVID TESTING SITE [address] [place] [address], IT IS ON THE RIGHT GOING OUT [address] APPROXIMATELY  2 MINUTES PAST [employer] ON THE RIGHT. ONCE YOUR COVID TEST IS COMPLETED,  PLEASE BEGIN THE QUARANTINE INSTRUCTIONS AS OUTLINED IN YOUR HANDOUT.                [name]  09/30/2019   Your procedure is scheduled on: 10/09/19   Report to [facility] Main  Entrance   Report to admitting at     1015 AM     Call this number if you have problems the morning of surgery [phone]    Remember: Do not eat food , candy gum or mints :After Midnight. You may have clear liquids from midnight until 0915am    CLEAR LIQUID DIET   Foods Allowed                                                                       Coffee and tea, regular and decaf                              Plain Jell-O any favor except red or purple                                            Fruit ices (not with fruit pulp)                                      Iced Popsicles                                     Carbonated beverages, regular and diet                                    Cranberry, grape and apple juices Sports drinks like Gatorade Lightly seasoned clear broth or consume(fat free) Sugar, honey syrup   _____________________________________________________________________    BRUSH YOUR TEETH MORNING OF SURGERY AND RINSE YOUR MOUTH OUT, NO CHEWING GUM CANDY OR MINTS.     Take these medicines the morning of surgery with A SIP OF WATER:  Inhalers as usual and bring, nasal spray, linzess, thyroid, prilosec  DO NOT TAKE ANY DIABETIC  MEDICATIONS DAY OF YOUR SURGERY  You may not have any metal on your body including hair pins and              piercings  Do not wear jewelry, make-up, lotions, powders or perfumes, deodorant             Do not wear nail polish on your fingernails.  Do not shave  48 hours prior to surgery.              Men may shave face and neck.   Do not bring valuables to the hospital. [place].  Contacts, dentures or bridgework may not be worn into surgery.  Leave suitcase in the car. After surgery it may be brought to your room.     Patients discharged the day of surgery will not be allowed to drive home. IF YOU ARE HAVING SURGERY AND GOING HOME THE SAME DAY, YOU MUST HAVE AN ADULT TO DRIVE YOU HOME AND BE WITH YOU FOR 24 HOURS. YOU MAY GO HOME BY TAXI OR UBER OR ORTHERWISE, BUT AN ADULT MUST ACCOMPANY YOU HOME AND STAY WITH YOU FOR 24 HOURS.  Name and phone number of your driver:  Special Instructions: N/A              Please read over the following fact sheets you were given: _____________________________________________________________________  [facility] - Preparing for Surgery Before surgery, you can play an important role.  Because skin is not sterile, your skin needs to be as free of germs as possible.  You can reduce the number of germs on your skin by washing with CHG (chlorahexidine gluconate) soap before surgery.  CHG is an antiseptic cleaner which kills germs and bonds with the skin to continue killing germs even after washing. Please DO NOT use if you have an allergy to CHG or antibacterial soaps.  If your skin becomes reddened/irritated stop using the CHG and inform your nurse when you arrive at Short Stay. Do not shave (including legs and underarms) for at least 48 hours prior to the first CHG shower.  You may shave your face/neck. Please follow these instructions carefully:  1.  Shower with CHG Soap the night  before surgery and the  morning of Surgery.  2.  If you choose to wash your hair, wash your hair first as usual with your  normal  shampoo.  3.  After you shampoo, rinse your hair and body thoroughly to remove the  shampoo.                           4.  Use CHG as you would any other liquid soap.  You can apply chg directly  to the skin and wash                       Gently with a scrungie or clean washcloth.  5.  Apply the CHG Soap to your body ONLY FROM THE NECK DOWN.   Do not use on face/ open                           Wound or open sores. Avoid contact with eyes, ears mouth and genitals (private parts).  Wash face,  Genitals (private parts) with your normal soap.             6.  Wash thoroughly, paying special attention to the area where your surgery  will be performed.  7.  Thoroughly rinse your body with warm water from the neck down.  8.  DO NOT shower/wash with your normal soap after using and rinsing off  the CHG Soap.                9.  Pat yourself dry with a clean towel.            10.  Wear clean pajamas.            11.  Place clean sheets on your bed the night of your first shower and do not  sleep with pets. Day of Surgery : Do not apply any lotions/deodorants the morning of surgery.  Please wear clean clothes to the hospital/surgery center.  FAILURE TO FOLLOW THESE INSTRUCTIONS MAY RESULT IN THE CANCELLATION OF YOUR SURGERY PATIENT SIGNATURE_________________________________  NURSE SIGNATURE__________________________________  ________________________________________________________________________

## 2019-10-01 ENCOUNTER — Encounter (HOSPITAL_COMMUNITY): Payer: Self-pay

## 2019-10-01 ENCOUNTER — Other Ambulatory Visit: Payer: Self-pay

## 2019-10-01 ENCOUNTER — Encounter (HOSPITAL_COMMUNITY)
Admission: RE | Admit: 2019-10-01 | Discharge: 2019-10-01 | Disposition: A | Discharge status: Home / Self Care | Payer: 59 | Source: Ambulatory Visit | Attending: Urology | Admitting: Urology

## 2019-10-01 DIAGNOSIS — Z01818 Encounter for other preprocedural examination: Secondary | ICD-10-CM | POA: Insufficient documentation

## 2019-10-01 HISTORY — DX: Anemia, unspecified: D64.9

## 2019-10-01 HISTORY — DX: Hypothyroidism, unspecified: E03.9

## 2019-10-01 HISTORY — DX: Unspecified asthma, uncomplicated: J45.909

## 2019-10-01 HISTORY — DX: Polycystic ovarian syndrome: E28.2

## 2019-10-01 HISTORY — DX: Family history of other specified conditions: Z84.89

## 2019-10-01 LAB — CBC
HCT: 38.8 % (ref 36.0–46.0)
Hemoglobin: 12.8 g/dL (ref 12.0–15.0)
MCH: 31.8 pg (ref 26.0–34.0)
MCHC: 33 g/dL (ref 30.0–36.0)
MCV: 96.5 fL (ref 80.0–100.0)
Platelets: 369 10*3/uL (ref 150–400)
RBC: 4.02 MIL/uL (ref 3.87–5.11)
RDW: 13.1 % (ref 11.5–15.5)
WBC: 5.9 10*3/uL (ref 4.0–10.5)
nRBC: 0 % (ref 0.0–0.2)

## 2019-10-01 LAB — BASIC METABOLIC PANEL
Anion gap: 9 (ref 5–15)
BUN: 14 mg/dL (ref 6–20)
CO2: 25 mmol/L (ref 22–32)
Calcium: 10.3 mg/dL (ref 8.9–10.3)
Chloride: 103 mmol/L (ref 98–111)
Creatinine, Ser: 0.66 mg/dL (ref 0.44–1.00)
GFR calc Af Amer: >60 mL/min (ref >60)
GFR calc non Af Amer: >60 mL/min (ref >60)
Glucose, Bld: 76 mg/dL (ref 70–99)
Potassium: 5.1 mmol/L (ref 3.5–5.1)
Sodium: 137 mmol/L (ref 135–145)

## 2019-10-01 NOTE — Progress Notes (Addendum)
Anesthesia Review:  PCP: Saw PCP - Tobie Lords , NP  for Telemedicine visit on 09/18/19 for Telemedicine visit for acute bacterial sinusitis.  Patient reports getting this 2x per year related to allergies.   [facility] They are to fax note . Office visit note from 09/23/19 on chart .  Denies any fevers, coronavirus symptoms.  Pt states was on a course of Amoxicillin and completed on 09/25/19.  Pt has slight cough at time of preop visit and slight  runny nose. Temp- 98.0  Reported to Konrad Felix, [facility] with no further orders.   Cardiologist : Chest x-ray : EKG : Echo : Stress test: Cardiac Cath :  Activity level: can do oa flight of stairs without difficulty.  Sleep Study/ CPAP :no  Fasting Blood Sugar :      / Checks Blood Sugar -- times a day:  no Blood Thinner/ Instructions /Last Dose: ASA / Instructions/ Last Dose :

## 2019-10-06 ENCOUNTER — Other Ambulatory Visit (HOSPITAL_COMMUNITY)
Admission: RE | Admit: 2019-10-06 | Discharge: 2019-10-06 | Disposition: A | Discharge status: Home / Self Care | Payer: MEDICAID | Source: Ambulatory Visit | Attending: Urology | Admitting: Urology

## 2019-10-06 DIAGNOSIS — Z20822 Contact with and (suspected) exposure to covid-19: Secondary | ICD-10-CM | POA: Insufficient documentation

## 2019-10-06 DIAGNOSIS — Z01812 Encounter for preprocedural laboratory examination: Secondary | ICD-10-CM | POA: Insufficient documentation

## 2019-10-07 LAB — SARS CORONAVIRUS 2 (TAT 6-24 HRS): SARS Coronavirus 2: NEGATIVE

## 2019-10-09 ENCOUNTER — Ambulatory Visit (HOSPITAL_COMMUNITY): Payer: 59

## 2019-10-09 ENCOUNTER — Encounter (HOSPITAL_COMMUNITY): Payer: Self-pay | Admitting: Urology

## 2019-10-09 ENCOUNTER — Ambulatory Visit (HOSPITAL_COMMUNITY): Payer: 59 | Admitting: Physician Assistant

## 2019-10-09 ENCOUNTER — Ambulatory Visit (HOSPITAL_COMMUNITY)
Admission: RE | Admit: 2019-10-09 | Discharge: 2019-10-09 | Disposition: A | Discharge status: Home / Self Care | Payer: 59 | Attending: Urology | Admitting: Urology

## 2019-10-09 ENCOUNTER — Encounter (HOSPITAL_COMMUNITY)
Admission: RE | Disposition: A | Discharge status: Home / Self Care | Payer: Self-pay | Source: Home / Self Care | Attending: Urology

## 2019-10-09 ENCOUNTER — Ambulatory Visit (HOSPITAL_COMMUNITY): Payer: 59 | Admitting: Certified Registered"

## 2019-10-09 DIAGNOSIS — Z79899 Other long term (current) drug therapy: Secondary | ICD-10-CM | POA: Diagnosis not present

## 2019-10-09 DIAGNOSIS — Z905 Acquired absence of kidney: Secondary | ICD-10-CM | POA: Insufficient documentation

## 2019-10-09 DIAGNOSIS — Z79890 Hormone replacement therapy (postmenopausal): Secondary | ICD-10-CM | POA: Diagnosis not present

## 2019-10-09 DIAGNOSIS — E039 Hypothyroidism, unspecified: Secondary | ICD-10-CM | POA: Diagnosis not present

## 2019-10-09 DIAGNOSIS — Z7984 Long term (current) use of oral hypoglycemic drugs: Secondary | ICD-10-CM | POA: Insufficient documentation

## 2019-10-09 DIAGNOSIS — Z85528 Personal history of other malignant neoplasm of kidney: Secondary | ICD-10-CM | POA: Diagnosis not present

## 2019-10-09 DIAGNOSIS — N200 Calculus of kidney: Secondary | ICD-10-CM | POA: Insufficient documentation

## 2019-10-09 DIAGNOSIS — J45909 Unspecified asthma, uncomplicated: Secondary | ICD-10-CM | POA: Diagnosis not present

## 2019-10-09 HISTORY — PX: CYSTOSCOPY/URETEROSCOPY/HOLMIUM LASER/STENT PLACEMENT: SHX6546

## 2019-10-09 LAB — GLUCOSE, CAPILLARY: Glucose-Capillary: 94 mg/dL (ref 70–99)

## 2019-10-09 SURGERY — CYSTOSCOPY/URETEROSCOPY/HOLMIUM LASER/STENT PLACEMENT
Anesthesia: General | Laterality: Right

## 2019-10-09 MED ORDER — PROPOFOL 10 MG/ML IV BOLUS
INTRAVENOUS | Status: DC | PRN
  Administered 2019-10-09: 190 mg via INTRAVENOUS

## 2019-10-09 MED ORDER — CEFAZOLIN SODIUM-DEXTROSE 2-4 GM/100ML-% IV SOLN
2.0000 g | Freq: Once | INTRAVENOUS | Status: AC
  Administered 2019-10-09: 2 g via INTRAVENOUS
  Filled 2019-10-09: qty 100

## 2019-10-09 MED ORDER — IOHEXOL 300 MG/ML  SOLN
INTRAMUSCULAR | Status: DC | PRN
  Administered 2019-10-09: 15 mL

## 2019-10-09 MED ORDER — DOCUSATE SODIUM 100 MG PO CAPS: 100.0000 mg | ORAL_CAPSULE | Freq: Every day | ORAL | 0 refills | Status: AC | PRN

## 2019-10-09 MED ORDER — PROPOFOL 10 MG/ML IV BOLUS
INTRAVENOUS | Status: AC
  Filled 2019-10-09: qty 40

## 2019-10-09 MED ORDER — CEPHALEXIN 500 MG PO CAPS: 500.0000 mg | ORAL_CAPSULE | Freq: Two times a day (BID) | ORAL | 0 refills | Status: AC

## 2019-10-09 MED ORDER — FENTANYL CITRATE (PF) 100 MCG/2ML IJ SOLN
25.0000 ug | INTRAMUSCULAR | Status: DC | PRN
  Administered 2019-10-09: 50 ug via INTRAVENOUS
  Administered 2019-10-09: 25 ug via INTRAVENOUS

## 2019-10-09 MED ORDER — ONDANSETRON HCL 4 MG/2ML IJ SOLN
INTRAMUSCULAR | Status: DC | PRN
  Administered 2019-10-09: 4 mg via INTRAVENOUS

## 2019-10-09 MED ORDER — FENTANYL CITRATE (PF) 100 MCG/2ML IJ SOLN
INTRAMUSCULAR | Status: AC
  Filled 2019-10-09: qty 2

## 2019-10-09 MED ORDER — DEXAMETHASONE SODIUM PHOSPHATE 10 MG/ML IJ SOLN
INTRAMUSCULAR | Status: DC | PRN
  Administered 2019-10-09: 4 mg via INTRAVENOUS

## 2019-10-09 MED ORDER — ORAL CARE MOUTH RINSE: 15.0000 mL | Freq: Once | OROMUCOSAL | Status: AC

## 2019-10-09 MED ORDER — PROMETHAZINE HCL 25 MG/ML IJ SOLN
6.2500 mg | INTRAMUSCULAR | Status: AC | PRN
  Administered 2019-10-09: 6.25 mg via INTRAVENOUS

## 2019-10-09 MED ORDER — LIDOCAINE 2% (20 MG/ML) 5 ML SYRINGE
INTRAMUSCULAR | Status: DC | PRN
  Administered 2019-10-09: 40 mg via INTRAVENOUS

## 2019-10-09 MED ORDER — FENTANYL CITRATE (PF) 100 MCG/2ML IJ SOLN
INTRAMUSCULAR | Status: DC | PRN
Start: 2019-10-09 — End: 2019-10-09
  Administered 2019-10-09: 50 ug via INTRAVENOUS
  Administered 2019-10-09 (×6): 25 ug via INTRAVENOUS

## 2019-10-09 MED ORDER — LACTATED RINGERS IV SOLN: INTRAVENOUS | Status: DC

## 2019-10-09 MED ORDER — MIDAZOLAM HCL 2 MG/2ML IJ SOLN
INTRAMUSCULAR | Status: DC | PRN
  Administered 2019-10-09: 2 mg via INTRAVENOUS

## 2019-10-09 MED ORDER — PROMETHAZINE HCL 25 MG/ML IJ SOLN
INTRAMUSCULAR | Status: AC
  Administered 2019-10-09: 6.25 mg via INTRAVENOUS
  Filled 2019-10-09: qty 1

## 2019-10-09 MED ORDER — FENTANYL CITRATE (PF) 100 MCG/2ML IJ SOLN
INTRAMUSCULAR | Status: AC
  Administered 2019-10-09: 50 ug via INTRAVENOUS
  Filled 2019-10-09: qty 2

## 2019-10-09 MED ORDER — OXYCODONE-ACETAMINOPHEN 5-325 MG PO TABS
1.0000 | ORAL_TABLET | Freq: Once | ORAL | Status: AC
  Administered 2019-10-09: 1 via ORAL

## 2019-10-09 MED ORDER — MIDAZOLAM HCL 2 MG/2ML IJ SOLN
INTRAMUSCULAR | Status: AC
  Filled 2019-10-09: qty 2

## 2019-10-09 MED ORDER — OXYCODONE-ACETAMINOPHEN 5-325 MG PO TABS
ORAL_TABLET | ORAL | Status: AC
  Filled 2019-10-09: qty 1

## 2019-10-09 MED ORDER — DEXAMETHASONE SODIUM PHOSPHATE 10 MG/ML IJ SOLN
INTRAMUSCULAR | Status: AC
  Filled 2019-10-09: qty 1

## 2019-10-09 MED ORDER — FENTANYL CITRATE (PF) 100 MCG/2ML IJ SOLN
INTRAMUSCULAR | Status: AC
  Administered 2019-10-09: 25 ug via INTRAVENOUS
  Filled 2019-10-09: qty 2

## 2019-10-09 MED ORDER — ONDANSETRON HCL 4 MG/2ML IJ SOLN
INTRAMUSCULAR | Status: AC
  Filled 2019-10-09: qty 2

## 2019-10-09 MED ORDER — SODIUM CHLORIDE 0.9 % IR SOLN
Status: DC | PRN
  Administered 2019-10-09: 3000 mL

## 2019-10-09 MED ORDER — CHLORHEXIDINE GLUCONATE 0.12 % MT SOLN
15.0000 mL | Freq: Once | OROMUCOSAL | Status: AC
  Administered 2019-10-09: 15 mL via OROMUCOSAL

## 2019-10-09 MED ORDER — OXYCODONE-ACETAMINOPHEN 5-325 MG PO TABS: 1.0000 | ORAL_TABLET | ORAL | 0 refills | Status: DC | PRN

## 2019-10-09 SURGICAL SUPPLY — 20 items
BAG URO CATCHER STRL LF (MISCELLANEOUS) ×3 IMPLANT
BASKET ZERO TIP NITINOL 2.4FR (BASKET) ×2 IMPLANT
BSKT STON RTRVL ZERO TP 2.4FR (BASKET) ×1
CATH INTERMIT  6FR 70CM (CATHETERS) IMPLANT
CLOTH BEACON ORANGE TIMEOUT ST (SAFETY) ×3 IMPLANT
COVER WAND RF STERILE (DRAPES) IMPLANT
FIBER LASER MOSES 200 DFL (Laser) ×2 IMPLANT
GLOVE BIOGEL M STRL SZ7.5 (GLOVE) ×3 IMPLANT
GOWN STRL REUS W/TWL LRG LVL3 (GOWN DISPOSABLE) ×6 IMPLANT
GUIDEWIRE STR DUAL SENSOR (WIRE) ×5 IMPLANT
KIT TURNOVER KIT A (KITS) IMPLANT
MANIFOLD NEPTUNE II (INSTRUMENTS) ×3 IMPLANT
NS IRRIG 1000ML POUR BTL (IV SOLUTION) IMPLANT
PACK CYSTO (CUSTOM PROCEDURE TRAY) ×3 IMPLANT
PENCIL SMOKE EVACUATOR (MISCELLANEOUS) IMPLANT
SHEATH URETERAL 12FRX35CM (MISCELLANEOUS) ×2 IMPLANT
STENT URET 6FRX26 CONTOUR (STENTS) ×2 IMPLANT
SYR 20ML LL LF (SYRINGE) ×2 IMPLANT
TUBING CONNECTING 10 (TUBING) ×2 IMPLANT
TUBING CONNECTING 10' (TUBING) ×1

## 2019-10-09 NOTE — Anesthesia Procedure Notes (Signed)
Procedure Name: LMA Insertion Date/Time: 10/09/2019 12:00 PM Performed by: Eben Burow, CRNA Pre-anesthesia Checklist: Patient identified, Emergency Drugs available, Suction available, Patient being monitored and Timeout performed Patient Re-evaluated:Patient Re-evaluated prior to induction Oxygen Delivery Method: Circle system utilized Preoxygenation: Pre-oxygenation with 100% oxygen Induction Type: IV induction Ventilation: Mask ventilation without difficulty LMA: LMA inserted LMA Size: 4.0 Number of attempts: 2 Placement Confirmation: positive ETCO2 Tube secured with: Tape Dental Injury: Teeth and Oropharynx as per pre-operative assessment

## 2019-10-09 NOTE — Discharge Instructions (Signed)
   Activity:  You are encouraged to ambulate frequently (about every hour during waking hours) to help prevent blood clots from forming in your legs or lungs.  However, you should not engage in any heavy lifting (> 10-15 lbs), strenuous activity, or straining.   Diet: You should advance your diet as instructed by your physician.  It will be normal to have some bloating, nausea, and abdominal discomfort intermittently.   Prescriptions:  You will be provided a prescription for pain medication to take as needed.  If your pain is not severe enough to require the prescription pain medication, you may take extra strength Tylenol instead which will have less side effects.  You should also take a prescribed stool softener to avoid straining with bowel movements as the prescription pain medication may constipate you.   What to call us about: You should call the office [phone]) if you develop fever > 101 or develop persistent vomiting. Activity:  You are encouraged to ambulate frequently (about every hour during waking hours) to help prevent blood clots from forming in your legs or lungs.  However, you should not engage in any heavy lifting (> 10-15 lbs), strenuous activity, or straining.  You have a ureteral stent in place draining your kidney. This will be removed in the office in 7-10 days. We will arrange for f/u appointment.

## 2019-10-09 NOTE — Transfer of Care (Signed)
Immediate Anesthesia Transfer of Care Note  Patient: [name]  Procedure(s) Performed: CYSTOSCOPY/RETROGRADE/URETEROSCOPY/HOLMIUM LASER/STENT PLACEMENT (Right )  Patient Location: PACU  Anesthesia Type:General  Level of Consciousness: awake, drowsy and patient cooperative  Airway & Oxygen Therapy: Patient Spontanous Breathing and Patient connected to face mask oxygen  Post-op Assessment: Report given to RN and Post -op Vital signs reviewed and stable  Post vital signs: Reviewed and stable  Last Vitals:  Vitals Value Taken Time  BP 129/82 10/09/19 1321  Temp    Pulse 86 10/09/19 1323  Resp 14 10/09/19 1323  SpO2 100 % 10/09/19 1323  Vitals shown include unvalidated device data.  Last Pain:  Vitals:   10/09/19 1022  TempSrc:   PainSc: 0-No pain         Complications: No complications documented.

## 2019-10-09 NOTE — Anesthesia Preprocedure Evaluation (Addendum)
Anesthesia Evaluation  Patient identified by MRN, date of birth, ID band Patient awake    Reviewed: Allergy & Precautions, NPO status , Patient's Chart, lab work & pertinent test results  Airway Mallampati: I  TM Distance: >3 FB Neck ROM: Full    Dental no notable dental hx.    Pulmonary asthma ,    Pulmonary exam normal breath sounds clear to auscultation       Cardiovascular Exercise Tolerance: Good Normal cardiovascular exam Rhythm:Regular Rate:Normal     Neuro/Psych negative neurological ROS  negative psych ROS   GI/Hepatic GERD  ,  Endo/Other  Hypothyroidism PCOS  Renal/GU Renal disease (renal cell cancer)  negative genitourinary   Musculoskeletal negative musculoskeletal ROS (+)   Abdominal Normal abdominal exam  (+)   Peds negative pediatric ROS (+)  Hematology negative hematology ROS (+)   Anesthesia Other Findings   Reproductive/Obstetrics                            Anesthesia Physical Anesthesia Plan  ASA: II  Anesthesia Plan: General   Post-op Pain Management:    Induction: Intravenous  PONV Risk Score and Plan: Midazolam, Dexamethasone, Treatment may vary due to age or medical condition and Ondansetron  Airway Management Planned: LMA  Additional Equipment:   Intra-op Plan:   Post-operative Plan: Extubation in OR  Informed Consent:   Plan Discussed with: CRNA and Anesthesiologist  Anesthesia Plan Comments:         Anesthesia Quick Evaluation

## 2019-10-09 NOTE — Op Note (Signed)
Operative Note  Preoperative diagnosis:  1.  Right renal stones  Postoperative diagnosis: 1.  Right renal stones  Procedure(s): 1.  Cystoscopy 2. Right retrograde pyelogram 3. Right ureteroscopy with laser lithotripsy of stones 4. Biopsy of right renal pelvis lesion 5. Fluoroscopy with intraoperative interpretation 6. Right ureteral stent placement  Surgeon: Rexene Alberts, MD  Assistants:  None  Anesthesia:  General  Complications:  None  EBL:  minimal  Specimens: 1.  ID Type Source Tests Collected by Time Destination  1 : Right renal pelvis lesion Tissue PATH Other SURGICAL PATHOLOGY Janith Lima, MD 10/09/2019 1254   A : Joaquim Lai for analysis Calculus PATH Calculus CALCULI, WITH PHOTOGRAPH (CLINICAL LAB) Janith Lima, MD 10/09/2019 1256   B : Stone culture Calculus PATH Calculus CALCULI, WITH PHOTOGRAPH (CLINICAL LAB) (Canceled) Janith Lima, MD 10/09/2019 1257    Drains/Catheters: 1.  6Fr x 26cm right ureteral stent  Intraoperative findings:   1. 88mm right distal ureteral stone, removed 2. 52mm papillary lesion in the right renal pelvis, biopsied 3. 6mm right renal pelvis stone with surrounding soft infectious debris, fragmented and removed 4. Chronically dilated right renal pelvis with dilated calyces.  5. Successful placement of right ureteral stent without a string  Indication:  [name] is a 49 y.o. female with symptomatic right renal stone seen on CT abdomen pelvis dated 08/20/2019 which in right upper pole burden measuring up to 0.6 cm that appeared to be in an infundibular dilated upper pole calyx. She also has a history of a right renal cell carcinoma status post right partial nephrectomy which was done in [place]. Given her intermittent flank pain and stones on CT scan she is being brought to the operating today for cystoscopy, right retrograde pyelogram, right ureteroscopy with laser lithotripsy, right ureteral stent placement. After thorough discussion  including all relevant risk benefits alternatives she presents today for this procedure.  Description of procedure: After informed consent was obtained from the patient, the patient was identified and taken to the operating room and placed in the supine position.  General anesthesia was administered as well as perioperative IV antibiotics.  At the beginning of the case, a time-out was performed to properly identify the patient, the surgery to be performed, and the surgical site.  Sequential compression devices were applied to the lower extremities at the beginning of the case for DVT prophylaxis.  The patient was then placed in the dorsal lithotomy supine position, prepped and draped in sterile fashion.  Preliminary scout fluoroscopy revealed that there was a 76mm calcification area at the right renal pelvis, which corresponds to the stone found on the preoperative CT scan. We then passed the 21-French rigid cystoscope through the urethra and into the bladder under vision without any difficulty , noting a normal urethra with some mild stenosis.  A systematic evaluation of the bladder revealed no evidence of any suspicious bladder lesions.  Ureteral orifices were in normal position.    Under cystoscopic and flouroscopic guidance, we cannulated the right ureteral orifice with a 5-French open-ended ureteral catheter and a gentle retrograde pyelogram was performed, revealing a normal caliber ureter without any filling defects. There was mild hydronephrosis of the collecting system. There was a 46mm filling defect in the right renal pelvis corresponding to the stone. A 0.038 sensor wire was then passed up to the level of the renal pelvis and secured to the drape as a safety wire. The ureteral catheter and cystoscope were removed, leaving the safety  wire in place.   A semi-rigid ureteroscope was passed alongside the wire up the distal ureter which appeared normal. In the right distal ureter we encountered a  proximally 4 mm stone. As the ureter was quite patent this was easily grasped with a 0 tip basket and removed and sent off for stone analysis. We then ran the semirigid scope up to the level of the renal pelvis noting no other stones along the course of the ureter. A second 0.038 sensor wire was passed under direct vision and the semirigid scope was removed. A 12 x 14 French ureteral access sheath was carefully advanced up the ureter to the level of the UPJ over this wire under fluoroscopic guidance. The flexible ureteroscope was advanced into the collecting system via the access sheath. The collecting system was inspected. The calculus was identified at the right renal pelvis. We also divide approximately 2 mm papillary lesion in the right renal pelvis. We used a Piranha grasper to biopsy this lesion and this was sent in [place] sent for pathology. Using the 200 micron holmium laser fiber, the stone was fragmented completely. Of note the stone had a soft outer infectious type incasing over a calcium appearing stone. This is dusted in small pieces. A 2.2 Fr zero tip basket was used to remove the fragments under visual guidance. These were sent for chemical analysis. We also sent some of the infectious appearing soft tissue for culture. With the ureteroscope in the kidney, a gentle pyelogram was performed to delineate the calyceal system and we evaluated the calyces systematically. We encountered no further stones. She did have a quite narrow and elongated upper pole infundibulum. Beyond this I did not see any sign of calcification on fluoroscopy so I elected not to incise this diverticulum. The rest of the stone fragments were very tiny and these were  irrigated away gently. The calyces were re-inspected and there were no significant stone fragment residual.   We then withdrew the ureteroscope back down the ureter along with the access sheath, noting no evidence of any stones along the course of the ureter.  Prior  to removing the ureteroscope, we did pass the sensor wire back up to the ureter to the renal pelvis.   Once the ureteroscope was removed, the sensor wire was backloaded through the rigid cystoscope, which was then advanced down the urethra and into the bladder. We then used the sensor wire under direct vision through the rigid cystoscope and under fluoroscopic guidance and passed up a 6-French, 26 cm double-pigtail ureteral stent up ureter, making sure that the proximal and distal ends coiled within the kidney and bladder respectively.  Note that we left no string attached to the distal end of the ureteral stent. The cystoscope was then advanced back into the bladder under vision.  We were able to see the distal stent coiling nicely within the bladder.  The bladder was then emptied with irrigation solution.  The cystoscope was then removed.   The patient tolerated the procedure well and there was no complication. Patient was awoken from anesthesia and taken to the recovery room in stable condition. I was present and scrubbed for the entirety of the case.  Plan:  Patient will be discharged home.  Follow up with me in 7 to 10 days for stent removal in the office.  [facility] R[facility]  Pager: [phone]

## 2019-10-09 NOTE — H&P (Addendum)
Office Visit Report     09/24/2019   --------------------------------------------------------------------------------   [name]  MRN: [id number]  DOB: 07/13/1970, 49 year old Female  SSN:    PRIMARY CARE:    REFERRING:    PROVIDER:  Rexene Alberts, M.D.  LOCATION:  [facility] [phone]     --------------------------------------------------------------------------------   CC/HPI: [name] is a 49 year old female seen in consultation for a right renal stone. She says the last month she was admitted in [place] to [place] with right pyelonephritis. During the course she obtained a CT abdomen pelvis dated 08/20/2019 that demonstrated a right renal stone of the right upper pole measuring up to 0.6 cm that appears to be in the infundibulum of a dilated upper pole calyx. There are also some subcentimeter hypoattenuating lesions which were too small to accurately characterize. Of note she has a history of a right partial nephrectomy for renal cell carcinoma that was done in [place]. There did not appear to be any recurrence of disease. She states that she has been experiencing some intermittent right flank pain which has been well controlled with Flomax. She reports some urinary frequency and urgency. She denies recent fevers or chills. She underwent a course of antibiotics that she has completed. She states that around the time of her partial nephrectomy she was told that she had kidney stones however these were never treated. She denies passing stones. She denies prior surgical intervention for urolithiasis. Denies weight loss or bone pain.     ALLERGIES: Tramadol    MEDICATIONS: Metformin Hcl 500 mg tablet  Omeprazole 20 mg capsule,delayed release  Tamsulosin Hcl 0.4 mg capsule  Amoxicillin  Cyanocobalamin 1000 Mcg  Docusate Sodium 100 mg capsule  Esomeprazole Magnesium 20 mg capsule,delayed release  Estradiol 1 mg tablet  Evening Primrose Oil 500 mg capsule   Fish Oil  Lidocaine-Hydrocortisone 3 %-0.5 % cream  Melatonin 10 mg tablet  Montelukast Sodium 10 mg tablet  Np Thyroid 120 mg tablet  Progesterone 200 mg capsule  Promethazine Hcl 25 mg tablet  Promethazine-Dextromethorphan  Pseudoephedrine Hcl 30 mg tablet  Spironolactone 100 mg tablet  Turmeric 500 mg capsule  Vitamin D, Cholecalciferol  Zinc Gluconate     GU PSH: Hysterectomy - 2011 Partial nephrectomy (laparoscopic) - 2006     NON-GU PSH: Appendectomy - 2007 Remove Gallbladder - 2008     GU PMH: None     PMH Notes: Kidney Cancer   NON-GU PMH: Asthma GERD    FAMILY HISTORY: 1 son - Runs in Family   SOCIAL HISTORY: Marital Status: Married Ethnicity: Not Hispanic Or Latino; Race: White Current Smoking Status: Patient has never smoked.   Tobacco Use Assessment Completed: Used Tobacco in last 30 days? Types of alcohol consumed: Wine. Social Drinker.  Drinks 1 caffeinated drink per day.    REVIEW OF SYSTEMS:    GU Review Female:   Patient reports get up at night to urinate. Patient denies frequent urination, hard to postpone urination, burning /pain with urination, leakage of urine, stream starts and stops, trouble starting your stream, have to strain to urinate, and being pregnant.  Gastrointestinal (Upper):   Patient reports nausea, vomiting, and indigestion/ heartburn.   Gastrointestinal (Lower):   Patient reports constipation. Patient denies diarrhea.  Constitutional:   Patient reports fatigue. Patient denies fever, night sweats, and weight loss.  Skin:   Patient denies skin rash/ lesion and itching.  Eyes:   Patient denies blurred vision and double vision.  Ears/ Nose/ Throat:   Patient reports sinus problems. Patient denies sore throat.  Hematologic/Lymphatic:   Patient denies swollen glands and easy bruising.  Cardiovascular:   Patient denies leg swelling and chest pains.  Respiratory:   Patient denies cough and shortness of breath.  Endocrine:   Patient  denies excessive thirst.  Musculoskeletal:   Patient denies back pain and joint pain.  Neurological:   Patient denies headaches and dizziness.  Psychologic:   Patient denies anxiety and depression.   VITAL SIGNS:      09/24/2019 01:26 PM  Weight 159 lb / 72.12 kg  Height 66 in / 167.64 cm  BP 119/81 mmHg  Pulse 109 /min  Temperature 97.5 F / 36.3 C  BMI 25.7 kg/m   MULTI-SYSTEM PHYSICAL EXAMINATION:    Constitutional: Well-nourished. No physical deformities. Normally developed. Good grooming.  Respiratory: No labored breathing, no use of accessory muscles.   Cardiovascular: Normal temperature, normal extremity pulses, no swelling, no varicosities.  Gastrointestinal: No mass, no tenderness, no rigidity, non obese abdomen. No CVA tenderness.     Complexity of Data:  Source Of History:  Patient, Medical Record Summary  Records Review:   Previous Hospital Records, Previous Patient Records  X-Ray Review: C.T. Abdomen/Pelvis: Reviewed Films. Reviewed Report.    Notes:                     CT A/P 08/20/2019 done in [place]. See scanned record.  Impression: Redemonstrated are postsurgical changes related to prior proximal nephrectomy. Previously described 1.6 cm hypodensity involving the superior aspect of the right kidney again favors dilated calyx. Other subcentimeter hypoattenuating lesions in the right kidney are too small to accurately characterize. The right renal calculi measures up to 0.6 cm. Other nonacute stable findings as above.   PROCEDURES:          Urinalysis - 81003 Dipstick Dipstick Cont'd  Color: Yellow Bilirubin: Neg  Appearance: Clear Ketones: Neg  Specific Gravity: 1.015 Blood: Neg  pH: 6.0 Protein: Neg  Glucose: Neg Urobilinogen: 0.2    Nitrites: Neg    Leukocyte Esterase: Neg    Notes:      ASSESSMENT:      ICD-10 Details  1 GU:   Renal calculus - N20.0   2   Right renal neoplasm - D49.511    PLAN:            Medications New Meds: Tamsulosin Hcl 0.4  mg capsule 1 capsule PO Q HS   #30  0 Refill(s)            Orders Labs Urinalysis w/Scope, CULTURE, URINE          Document Letter(s):  Created for Patient: Clinical Summary         Notes:   1. Right renal stone: CT abdomen pelvis 08/20/2019 with 0.6 cm renal stone involving an infundibulum likely obstructing a right upper pole calyx. Discussed options for management including PCNL, ureteroscopy with laser lithotripsy and ESWL. Discussed risks and benefits of each. Patient elects for ureteroscopy with laser lithotripsy. We will schedule. Obtain urine culture today. Upon follow-up we will discuss stone prevention and metabolic evaluation.   2. Renal cell carcinoma of the right kidney status post right partial nephrectomy in August 2006. CT abdomen pelvis dated 08/20/2019 with subcentimeter hypoattenuating lesions in the right kidney which were too small to accurately characterize. No signs of recurrence or metastatic disease.     Signed by Rexene Alberts, M.D. on 09/25/19  at 8:17 AM (EDT)  Urology Preoperative H&P   Chief Complaint: right flank pain  History of Present Illness: [name] is a 49 y.o. female with right renal stone as above. No changes.    Past Medical History:  Diagnosis Date  . Anemia   . Asthma    allergy   . Family history of adverse reaction to anesthesia   . GERD (gastroesophageal reflux disease)   . Hypothyroidism   . PCOS (polycystic ovarian syndrome)   . Renal cell cancer [facility])     Past Surgical History:  Procedure Laterality Date  . ABDOMINAL HYSTERECTOMY    . APPENDECTOMY    . CHOLECYSTECTOMY    . ELBOW SURGERY    . HEMORRHOID SURGERY    . KIDNEY SURGERY      Allergies:  Allergies  Allergen Reactions  . Tramadol Other (See Comments)    Severe Headaches Severe Headaches   . Adhesive [Tape] Rash    History reviewed. No pertinent family history.  Social History:  reports that she has never smoked. She has never used smokeless tobacco.  She reports that she does not drink alcohol and does not use drugs.  ROS: A complete review of systems was performed.  All systems are negative except for pertinent findings as noted.  Physical Exam:  Vital signs in last 24 hours: Temp:  [98.4 F (36.9 C)] 98.4 F (36.9 C) (09/10 1019) Pulse Rate:  [80] 80 (09/10 1019) Resp:  [18] 18 (09/10 1019) BP: (121)/(64) 121/64 (09/10 1019) SpO2:  [99 %] 99 % (09/10 1019) Weight:  [74.5 kg] 74.5 kg (09/10 1022) Constitutional:  Alert and oriented, No acute distress Cardiovascular: Regular rate and rhythm Respiratory: Normal respiratory effort, Lungs clear bilaterally GI: Abdomen is soft, nontender, nondistended, no abdominal masses GU: No CVA tenderness Lymphatic: No lymphadenopathy Neurologic: Grossly intact, no focal deficits Psychiatric: Normal mood and affect  Laboratory Data:  No results for input(s): WBC, HGB, HCT, PLT in the last 72 hours.  No results for input(s): NA, K, CL, GLUCOSE, BUN, CALCIUM, CREATININE in the last 72 hours.  Invalid input(s): CO3   No results found for this or any previous visit (from the past 24 hour(s)). Recent Results (from the past 240 hour(s))  SARS CORONAVIRUS 2 (TAT 6-24 HRS) Nasopharyngeal Nasopharyngeal Swab     Status: None   Collection Time: 10/06/19  2:46 PM   Specimen: Nasopharyngeal Swab  Result Value Ref Range Status   SARS Coronavirus 2 NEGATIVE NEGATIVE Final    Comment: (NOTE) SARS-CoV-2 target nucleic acids are NOT DETECTED.  The SARS-CoV-2 RNA is generally detectable in upper and lower respiratory specimens during the acute phase of infection. Negative results do not preclude SARS-CoV-2 infection, do not rule out co-infections with other pathogens, and should not be used as the sole basis for treatment or other patient management decisions. Negative results must be combined with clinical observations, patient history, and epidemiological information. The expected result is  Negative.  Fact Sheet for Patients: SugarRoll.be  Fact Sheet for Healthcare Providers: https://www.woods-mathews.com/  This test is not yet approved or cleared by the [place] FDA and  has been authorized for detection and/or diagnosis of SARS-CoV-2 by FDA under an Emergency Use Authorization (EUA). This EUA will remain  in effect (meaning this test can be used) for the duration of the COVID-19 declaration under Se ction 564(b)(1) of the Act, 21 U.S.C. section 360bbb-3(b)(1), unless the authorization is terminated or revoked sooner.  Performed at [facility]  [facility], [place] [address], [place], [place] [address]     Renal Function: No results for input(s): CREATININE in the last 168 hours. Estimated Creatinine Clearance: 87.8 mL/min (by C-G formula based on SCr of 0.66 mg/dL).  Radiologic Imaging: No results found.  I independently reviewed the above imaging studies.  Assessment and Plan [name] is a 49 y.o. female with right renal stone here for cystoscopy, right ureteroscopy, laser lithotripsy, basket extraction of stone, right stent placement   Matt R. Eiman Maret MD 10/09/2019, 11:43 AM  [facility] Pager: [phone]): [phone]

## 2019-10-10 NOTE — Anesthesia Postprocedure Evaluation (Signed)
Anesthesia Post Note  Patient: [name]  Procedure(s) Performed: CYSTOSCOPY/RETROGRADE/URETEROSCOPY/HOLMIUM LASER/STENT PLACEMENT (Right )     Patient location during evaluation: PACU Anesthesia Type: General Level of consciousness: sedated Pain management: pain level controlled Vital Signs Assessment: post-procedure vital signs reviewed and stable Respiratory status: spontaneous breathing and respiratory function stable Cardiovascular status: stable Postop Assessment: no apparent nausea or vomiting Anesthetic complications: no   No complications documented.  Last Vitals:  Vitals:   10/09/19 1500 10/09/19 1600  BP: 107/65 110/70  Pulse: 88 84  Resp: 16 16  Temp: 36.7 C   SpO2: 98% 98%    Last Pain:  Vitals:   10/09/19 1600  TempSrc:   PainSc: [place]

## 2019-10-12 LAB — SURGICAL PATHOLOGY

## 2019-10-13 ENCOUNTER — Encounter (HOSPITAL_COMMUNITY): Payer: Self-pay | Admitting: Urology

## 2019-10-15 LAB — CALCULI, WITH PHOTOGRAPH (CLINICAL LAB)
Calcium Oxalate Dihydrate: 90 %
Hydroxyapatite: 10 %
Weight Calculi: 2 mg

## 2021-02-19 DIAGNOSIS — R61 Generalized hyperhidrosis: Secondary | ICD-10-CM | POA: Diagnosis not present

## 2021-02-19 DIAGNOSIS — R519 Headache, unspecified: Secondary | ICD-10-CM | POA: Diagnosis not present

## 2021-02-19 DIAGNOSIS — R55 Syncope and collapse: Secondary | ICD-10-CM | POA: Diagnosis not present

## 2021-02-19 DIAGNOSIS — Z79899 Other long term (current) drug therapy: Secondary | ICD-10-CM | POA: Diagnosis not present

## 2021-02-19 DIAGNOSIS — Z8616 Personal history of COVID-19: Secondary | ICD-10-CM | POA: Diagnosis not present

## 2021-02-19 DIAGNOSIS — Z9049 Acquired absence of other specified parts of digestive tract: Secondary | ICD-10-CM | POA: Diagnosis not present

## 2021-03-27 DIAGNOSIS — M5126 Other intervertebral disc displacement, lumbar region: Secondary | ICD-10-CM | POA: Diagnosis not present

## 2021-04-24 DIAGNOSIS — M545 Low back pain, unspecified: Secondary | ICD-10-CM | POA: Diagnosis not present

## 2021-12-20 DIAGNOSIS — Z791 Long term (current) use of non-steroidal anti-inflammatories (NSAID): Secondary | ICD-10-CM | POA: Diagnosis not present

## 2021-12-20 DIAGNOSIS — M5441 Lumbago with sciatica, right side: Secondary | ICD-10-CM | POA: Diagnosis not present

## 2021-12-20 DIAGNOSIS — Z79899 Other long term (current) drug therapy: Secondary | ICD-10-CM | POA: Diagnosis not present

## 2021-12-20 DIAGNOSIS — M5416 Radiculopathy, lumbar region: Secondary | ICD-10-CM | POA: Diagnosis not present

## 2021-12-20 DIAGNOSIS — Z7952 Long term (current) use of systemic steroids: Secondary | ICD-10-CM | POA: Diagnosis not present

## 2023-03-14 DIAGNOSIS — R051 Acute cough: Secondary | ICD-10-CM | POA: Diagnosis not present

## 2023-03-14 DIAGNOSIS — B349 Viral infection, unspecified: Secondary | ICD-10-CM | POA: Diagnosis not present

## 2023-03-14 DIAGNOSIS — R509 Fever, unspecified: Secondary | ICD-10-CM | POA: Diagnosis not present

## 2023-03-26 DIAGNOSIS — M545 Low back pain, unspecified: Secondary | ICD-10-CM | POA: Diagnosis not present

## 2023-03-26 DIAGNOSIS — M2540 Effusion, unspecified joint: Secondary | ICD-10-CM | POA: Diagnosis not present

## 2023-03-26 DIAGNOSIS — M62830 Muscle spasm of back: Secondary | ICD-10-CM | POA: Diagnosis not present

## 2023-03-26 DIAGNOSIS — E039 Hypothyroidism, unspecified: Secondary | ICD-10-CM | POA: Diagnosis not present

## 2023-04-12 DIAGNOSIS — E782 Mixed hyperlipidemia: Secondary | ICD-10-CM | POA: Diagnosis not present

## 2023-04-12 DIAGNOSIS — Z131 Encounter for screening for diabetes mellitus: Secondary | ICD-10-CM | POA: Diagnosis not present

## 2023-04-12 DIAGNOSIS — E039 Hypothyroidism, unspecified: Secondary | ICD-10-CM | POA: Diagnosis not present

## 2023-04-12 DIAGNOSIS — K219 Gastro-esophageal reflux disease without esophagitis: Secondary | ICD-10-CM | POA: Diagnosis not present

## 2023-04-15 DIAGNOSIS — M9902 Segmental and somatic dysfunction of thoracic region: Secondary | ICD-10-CM | POA: Diagnosis not present

## 2023-04-15 DIAGNOSIS — M47813 Spondylosis without myelopathy or radiculopathy, cervicothoracic region: Secondary | ICD-10-CM | POA: Diagnosis not present

## 2023-04-15 DIAGNOSIS — M9901 Segmental and somatic dysfunction of cervical region: Secondary | ICD-10-CM | POA: Diagnosis not present

## 2023-04-15 DIAGNOSIS — M47817 Spondylosis without myelopathy or radiculopathy, lumbosacral region: Secondary | ICD-10-CM | POA: Diagnosis not present

## 2023-04-18 DIAGNOSIS — M9901 Segmental and somatic dysfunction of cervical region: Secondary | ICD-10-CM | POA: Diagnosis not present

## 2023-04-18 DIAGNOSIS — M9902 Segmental and somatic dysfunction of thoracic region: Secondary | ICD-10-CM | POA: Diagnosis not present

## 2023-04-18 DIAGNOSIS — M47813 Spondylosis without myelopathy or radiculopathy, cervicothoracic region: Secondary | ICD-10-CM | POA: Diagnosis not present

## 2023-04-18 DIAGNOSIS — M47817 Spondylosis without myelopathy or radiculopathy, lumbosacral region: Secondary | ICD-10-CM | POA: Diagnosis not present

## 2023-04-23 DIAGNOSIS — M47813 Spondylosis without myelopathy or radiculopathy, cervicothoracic region: Secondary | ICD-10-CM | POA: Diagnosis not present

## 2023-04-23 DIAGNOSIS — M9901 Segmental and somatic dysfunction of cervical region: Secondary | ICD-10-CM | POA: Diagnosis not present

## 2023-04-23 DIAGNOSIS — M47817 Spondylosis without myelopathy or radiculopathy, lumbosacral region: Secondary | ICD-10-CM | POA: Diagnosis not present

## 2023-04-23 DIAGNOSIS — M9902 Segmental and somatic dysfunction of thoracic region: Secondary | ICD-10-CM | POA: Diagnosis not present

## 2023-04-25 DIAGNOSIS — M9901 Segmental and somatic dysfunction of cervical region: Secondary | ICD-10-CM | POA: Diagnosis not present

## 2023-04-25 DIAGNOSIS — M9902 Segmental and somatic dysfunction of thoracic region: Secondary | ICD-10-CM | POA: Diagnosis not present

## 2023-04-25 DIAGNOSIS — M47817 Spondylosis without myelopathy or radiculopathy, lumbosacral region: Secondary | ICD-10-CM | POA: Diagnosis not present

## 2023-04-25 DIAGNOSIS — M47813 Spondylosis without myelopathy or radiculopathy, cervicothoracic region: Secondary | ICD-10-CM | POA: Diagnosis not present

## 2023-04-29 DIAGNOSIS — M9902 Segmental and somatic dysfunction of thoracic region: Secondary | ICD-10-CM | POA: Diagnosis not present

## 2023-04-29 DIAGNOSIS — M47813 Spondylosis without myelopathy or radiculopathy, cervicothoracic region: Secondary | ICD-10-CM | POA: Diagnosis not present

## 2023-04-29 DIAGNOSIS — M9901 Segmental and somatic dysfunction of cervical region: Secondary | ICD-10-CM | POA: Diagnosis not present

## 2023-04-29 DIAGNOSIS — M47817 Spondylosis without myelopathy or radiculopathy, lumbosacral region: Secondary | ICD-10-CM | POA: Diagnosis not present

## 2023-05-02 DIAGNOSIS — M9901 Segmental and somatic dysfunction of cervical region: Secondary | ICD-10-CM | POA: Diagnosis not present

## 2023-05-02 DIAGNOSIS — M9902 Segmental and somatic dysfunction of thoracic region: Secondary | ICD-10-CM | POA: Diagnosis not present

## 2023-05-02 DIAGNOSIS — M47813 Spondylosis without myelopathy or radiculopathy, cervicothoracic region: Secondary | ICD-10-CM | POA: Diagnosis not present

## 2023-05-02 DIAGNOSIS — M47817 Spondylosis without myelopathy or radiculopathy, lumbosacral region: Secondary | ICD-10-CM | POA: Diagnosis not present

## 2023-05-03 DIAGNOSIS — J019 Acute sinusitis, unspecified: Secondary | ICD-10-CM | POA: Diagnosis not present

## 2023-05-07 DIAGNOSIS — M47817 Spondylosis without myelopathy or radiculopathy, lumbosacral region: Secondary | ICD-10-CM | POA: Diagnosis not present

## 2023-05-07 DIAGNOSIS — M47813 Spondylosis without myelopathy or radiculopathy, cervicothoracic region: Secondary | ICD-10-CM | POA: Diagnosis not present

## 2023-05-07 DIAGNOSIS — M9902 Segmental and somatic dysfunction of thoracic region: Secondary | ICD-10-CM | POA: Diagnosis not present

## 2023-05-07 DIAGNOSIS — M9901 Segmental and somatic dysfunction of cervical region: Secondary | ICD-10-CM | POA: Diagnosis not present

## 2023-05-14 DIAGNOSIS — M9901 Segmental and somatic dysfunction of cervical region: Secondary | ICD-10-CM | POA: Diagnosis not present

## 2023-05-14 DIAGNOSIS — M47817 Spondylosis without myelopathy or radiculopathy, lumbosacral region: Secondary | ICD-10-CM | POA: Diagnosis not present

## 2023-05-14 DIAGNOSIS — M9902 Segmental and somatic dysfunction of thoracic region: Secondary | ICD-10-CM | POA: Diagnosis not present

## 2023-05-14 DIAGNOSIS — M47813 Spondylosis without myelopathy or radiculopathy, cervicothoracic region: Secondary | ICD-10-CM | POA: Diagnosis not present

## 2023-05-21 DIAGNOSIS — M9901 Segmental and somatic dysfunction of cervical region: Secondary | ICD-10-CM | POA: Diagnosis not present

## 2023-05-21 DIAGNOSIS — M47813 Spondylosis without myelopathy or radiculopathy, cervicothoracic region: Secondary | ICD-10-CM | POA: Diagnosis not present

## 2023-05-21 DIAGNOSIS — M9902 Segmental and somatic dysfunction of thoracic region: Secondary | ICD-10-CM | POA: Diagnosis not present

## 2023-05-21 DIAGNOSIS — M47817 Spondylosis without myelopathy or radiculopathy, lumbosacral region: Secondary | ICD-10-CM | POA: Diagnosis not present

## 2023-05-27 DIAGNOSIS — M47817 Spondylosis without myelopathy or radiculopathy, lumbosacral region: Secondary | ICD-10-CM | POA: Diagnosis not present

## 2023-05-27 DIAGNOSIS — M47813 Spondylosis without myelopathy or radiculopathy, cervicothoracic region: Secondary | ICD-10-CM | POA: Diagnosis not present

## 2023-05-27 DIAGNOSIS — M9901 Segmental and somatic dysfunction of cervical region: Secondary | ICD-10-CM | POA: Diagnosis not present

## 2023-05-27 DIAGNOSIS — M9902 Segmental and somatic dysfunction of thoracic region: Secondary | ICD-10-CM | POA: Diagnosis not present

## 2023-06-17 DIAGNOSIS — R350 Frequency of micturition: Secondary | ICD-10-CM | POA: Diagnosis not present

## 2023-06-17 DIAGNOSIS — M722 Plantar fascial fibromatosis: Secondary | ICD-10-CM | POA: Diagnosis not present

## 2023-06-17 DIAGNOSIS — R110 Nausea: Secondary | ICD-10-CM | POA: Diagnosis not present

## 2023-06-17 DIAGNOSIS — R197 Diarrhea, unspecified: Secondary | ICD-10-CM | POA: Diagnosis not present

## 2023-06-18 DIAGNOSIS — R197 Diarrhea, unspecified: Secondary | ICD-10-CM | POA: Diagnosis not present

## 2023-06-27 DIAGNOSIS — M47813 Spondylosis without myelopathy or radiculopathy, cervicothoracic region: Secondary | ICD-10-CM | POA: Diagnosis not present

## 2023-06-27 DIAGNOSIS — M9902 Segmental and somatic dysfunction of thoracic region: Secondary | ICD-10-CM | POA: Diagnosis not present

## 2023-06-27 DIAGNOSIS — M9901 Segmental and somatic dysfunction of cervical region: Secondary | ICD-10-CM | POA: Diagnosis not present

## 2023-06-27 DIAGNOSIS — M47817 Spondylosis without myelopathy or radiculopathy, lumbosacral region: Secondary | ICD-10-CM | POA: Diagnosis not present

## 2023-07-15 DIAGNOSIS — M722 Plantar fascial fibromatosis: Secondary | ICD-10-CM | POA: Diagnosis not present

## 2023-07-18 DIAGNOSIS — M9902 Segmental and somatic dysfunction of thoracic region: Secondary | ICD-10-CM | POA: Diagnosis not present

## 2023-07-18 DIAGNOSIS — M47817 Spondylosis without myelopathy or radiculopathy, lumbosacral region: Secondary | ICD-10-CM | POA: Diagnosis not present

## 2023-07-18 DIAGNOSIS — M47813 Spondylosis without myelopathy or radiculopathy, cervicothoracic region: Secondary | ICD-10-CM | POA: Diagnosis not present

## 2023-07-18 DIAGNOSIS — M9901 Segmental and somatic dysfunction of cervical region: Secondary | ICD-10-CM | POA: Diagnosis not present

## 2023-07-23 DIAGNOSIS — M722 Plantar fascial fibromatosis: Secondary | ICD-10-CM | POA: Diagnosis not present

## 2023-08-05 DIAGNOSIS — M47817 Spondylosis without myelopathy or radiculopathy, lumbosacral region: Secondary | ICD-10-CM | POA: Diagnosis not present

## 2023-08-05 DIAGNOSIS — M9902 Segmental and somatic dysfunction of thoracic region: Secondary | ICD-10-CM | POA: Diagnosis not present

## 2023-08-05 DIAGNOSIS — M47813 Spondylosis without myelopathy or radiculopathy, cervicothoracic region: Secondary | ICD-10-CM | POA: Diagnosis not present

## 2023-08-05 DIAGNOSIS — M9901 Segmental and somatic dysfunction of cervical region: Secondary | ICD-10-CM | POA: Diagnosis not present

## 2023-08-08 DIAGNOSIS — J0110 Acute frontal sinusitis, unspecified: Secondary | ICD-10-CM | POA: Diagnosis not present

## 2023-08-08 DIAGNOSIS — H66001 Acute suppurative otitis media without spontaneous rupture of ear drum, right ear: Secondary | ICD-10-CM | POA: Diagnosis not present

## 2023-08-08 DIAGNOSIS — H6121 Impacted cerumen, right ear: Secondary | ICD-10-CM | POA: Diagnosis not present

## 2023-08-15 DIAGNOSIS — M722 Plantar fascial fibromatosis: Secondary | ICD-10-CM | POA: Diagnosis not present

## 2023-08-19 DIAGNOSIS — M47817 Spondylosis without myelopathy or radiculopathy, lumbosacral region: Secondary | ICD-10-CM | POA: Diagnosis not present

## 2023-08-19 DIAGNOSIS — M9902 Segmental and somatic dysfunction of thoracic region: Secondary | ICD-10-CM | POA: Diagnosis not present

## 2023-08-19 DIAGNOSIS — M9901 Segmental and somatic dysfunction of cervical region: Secondary | ICD-10-CM | POA: Diagnosis not present

## 2023-08-19 DIAGNOSIS — M47813 Spondylosis without myelopathy or radiculopathy, cervicothoracic region: Secondary | ICD-10-CM | POA: Diagnosis not present

## 2023-09-10 DIAGNOSIS — M722 Plantar fascial fibromatosis: Secondary | ICD-10-CM | POA: Diagnosis not present

## 2023-09-16 DIAGNOSIS — R110 Nausea: Secondary | ICD-10-CM | POA: Diagnosis not present

## 2023-09-16 DIAGNOSIS — N3000 Acute cystitis without hematuria: Secondary | ICD-10-CM | POA: Diagnosis not present

## 2023-09-16 DIAGNOSIS — R399 Unspecified symptoms and signs involving the genitourinary system: Secondary | ICD-10-CM | POA: Diagnosis not present

## 2023-09-24 DIAGNOSIS — E782 Mixed hyperlipidemia: Secondary | ICD-10-CM | POA: Diagnosis not present

## 2023-09-24 DIAGNOSIS — N959 Unspecified menopausal and perimenopausal disorder: Secondary | ICD-10-CM | POA: Diagnosis not present

## 2023-09-24 DIAGNOSIS — E039 Hypothyroidism, unspecified: Secondary | ICD-10-CM | POA: Diagnosis not present

## 2023-09-24 DIAGNOSIS — N390 Urinary tract infection, site not specified: Secondary | ICD-10-CM | POA: Diagnosis not present

## 2023-09-26 ENCOUNTER — Other Ambulatory Visit (HOSPITAL_BASED_OUTPATIENT_CLINIC_OR_DEPARTMENT_OTHER): Payer: Self-pay | Admitting: Family Medicine

## 2023-09-26 DIAGNOSIS — E782 Mixed hyperlipidemia: Secondary | ICD-10-CM

## 2023-09-27 ENCOUNTER — Ambulatory Visit (HOSPITAL_BASED_OUTPATIENT_CLINIC_OR_DEPARTMENT_OTHER)
Admission: RE | Admit: 2023-09-27 | Discharge: 2023-09-27 | Disposition: A | Discharge status: Home / Self Care | Payer: Self-pay | Source: Ambulatory Visit | Attending: Family Medicine | Admitting: Family Medicine

## 2023-09-27 DIAGNOSIS — E782 Mixed hyperlipidemia: Secondary | ICD-10-CM | POA: Insufficient documentation

## 2023-10-03 DIAGNOSIS — J069 Acute upper respiratory infection, unspecified: Secondary | ICD-10-CM | POA: Diagnosis not present

## 2023-10-10 DIAGNOSIS — M722 Plantar fascial fibromatosis: Secondary | ICD-10-CM | POA: Diagnosis not present

## 2023-10-17 DIAGNOSIS — Z760 Encounter for issue of repeat prescription: Secondary | ICD-10-CM | POA: Diagnosis not present

## 2023-10-17 DIAGNOSIS — B001 Herpesviral vesicular dermatitis: Secondary | ICD-10-CM | POA: Diagnosis not present

## 2023-10-22 DIAGNOSIS — Z6825 Body mass index (BMI) 25.0-25.9, adult: Secondary | ICD-10-CM | POA: Diagnosis not present

## 2023-10-22 DIAGNOSIS — N6311 Unspecified lump in the right breast, upper outer quadrant: Secondary | ICD-10-CM | POA: Diagnosis not present

## 2023-10-28 DIAGNOSIS — N6002 Solitary cyst of left breast: Secondary | ICD-10-CM | POA: Diagnosis not present

## 2023-10-28 DIAGNOSIS — R928 Other abnormal and inconclusive findings on diagnostic imaging of breast: Secondary | ICD-10-CM | POA: Diagnosis not present

## 2023-10-28 DIAGNOSIS — N6489 Other specified disorders of breast: Secondary | ICD-10-CM | POA: Diagnosis not present

## 2023-10-30 DIAGNOSIS — M25572 Pain in left ankle and joints of left foot: Secondary | ICD-10-CM | POA: Diagnosis not present

## 2023-10-30 DIAGNOSIS — M25571 Pain in right ankle and joints of right foot: Secondary | ICD-10-CM | POA: Diagnosis not present

## 2023-11-01 DIAGNOSIS — Z01419 Encounter for gynecological examination (general) (routine) without abnormal findings: Secondary | ICD-10-CM | POA: Diagnosis not present

## 2023-11-04 DIAGNOSIS — N6012 Diffuse cystic mastopathy of left breast: Secondary | ICD-10-CM | POA: Diagnosis not present

## 2024-01-12 ENCOUNTER — Encounter (HOSPITAL_COMMUNITY): Payer: Self-pay | Admitting: Emergency Medicine

## 2024-01-12 ENCOUNTER — Other Ambulatory Visit: Payer: Self-pay

## 2024-01-12 ENCOUNTER — Inpatient Hospital Stay (HOSPITAL_COMMUNITY)
Admission: EM | Admit: 2024-01-12 | Discharge: 2024-01-16 | DRG: 872 | Disposition: A | Payer: MEDICAID | Attending: Internal Medicine | Admitting: Internal Medicine

## 2024-01-12 DIAGNOSIS — Z87442 Personal history of urinary calculi: Secondary | ICD-10-CM

## 2024-01-12 DIAGNOSIS — E861 Hypovolemia: Secondary | ICD-10-CM | POA: Diagnosis present

## 2024-01-12 DIAGNOSIS — R6520 Severe sepsis without septic shock: Secondary | ICD-10-CM | POA: Diagnosis present

## 2024-01-12 DIAGNOSIS — A4151 Sepsis due to Escherichia coli [E. coli]: Principal | ICD-10-CM | POA: Diagnosis present

## 2024-01-12 DIAGNOSIS — K219 Gastro-esophageal reflux disease without esophagitis: Secondary | ICD-10-CM | POA: Diagnosis present

## 2024-01-12 DIAGNOSIS — Z87440 Personal history of urinary (tract) infections: Secondary | ICD-10-CM

## 2024-01-12 DIAGNOSIS — Z91048 Other nonmedicinal substance allergy status: Secondary | ICD-10-CM

## 2024-01-12 DIAGNOSIS — E871 Hypo-osmolality and hyponatremia: Secondary | ICD-10-CM | POA: Diagnosis present

## 2024-01-12 DIAGNOSIS — E782 Mixed hyperlipidemia: Secondary | ICD-10-CM | POA: Diagnosis present

## 2024-01-12 DIAGNOSIS — G894 Chronic pain syndrome: Secondary | ICD-10-CM | POA: Diagnosis present

## 2024-01-12 DIAGNOSIS — N3020 Other chronic cystitis without hematuria: Secondary | ICD-10-CM | POA: Diagnosis present

## 2024-01-12 DIAGNOSIS — Z79899 Other long term (current) drug therapy: Secondary | ICD-10-CM

## 2024-01-12 DIAGNOSIS — N12 Tubulo-interstitial nephritis, not specified as acute or chronic: Secondary | ICD-10-CM | POA: Diagnosis present

## 2024-01-12 DIAGNOSIS — Z905 Acquired absence of kidney: Secondary | ICD-10-CM

## 2024-01-12 DIAGNOSIS — Z885 Allergy status to narcotic agent status: Secondary | ICD-10-CM

## 2024-01-12 DIAGNOSIS — Z85528 Personal history of other malignant neoplasm of kidney: Secondary | ICD-10-CM

## 2024-01-12 DIAGNOSIS — K5900 Constipation, unspecified: Secondary | ICD-10-CM | POA: Diagnosis present

## 2024-01-12 DIAGNOSIS — A419 Sepsis, unspecified organism: Principal | ICD-10-CM

## 2024-01-12 DIAGNOSIS — Z7984 Long term (current) use of oral hypoglycemic drugs: Secondary | ICD-10-CM

## 2024-01-12 DIAGNOSIS — Z79890 Hormone replacement therapy (postmenopausal): Secondary | ICD-10-CM

## 2024-01-12 DIAGNOSIS — N200 Calculus of kidney: Secondary | ICD-10-CM | POA: Diagnosis present

## 2024-01-12 DIAGNOSIS — Z90710 Acquired absence of both cervix and uterus: Secondary | ICD-10-CM

## 2024-01-12 DIAGNOSIS — E039 Hypothyroidism, unspecified: Secondary | ICD-10-CM | POA: Diagnosis present

## 2024-01-12 DIAGNOSIS — E872 Acidosis, unspecified: Secondary | ICD-10-CM | POA: Diagnosis present

## 2024-01-12 NOTE — ED Triage Notes (Signed)
 Pt arrives w/ c/o lower back pain, fevers at home x 1 week. Pt also c/o N/V. Reports she took tylenol  & nausea meds PTA. Highest fever reports at home 102.

## 2024-01-13 ENCOUNTER — Emergency Department (HOSPITAL_COMMUNITY)

## 2024-01-13 DIAGNOSIS — N12 Tubulo-interstitial nephritis, not specified as acute or chronic: Secondary | ICD-10-CM | POA: Diagnosis present

## 2024-01-13 DIAGNOSIS — E872 Acidosis, unspecified: Secondary | ICD-10-CM | POA: Diagnosis present

## 2024-01-13 DIAGNOSIS — Z0389 Encounter for observation for other suspected diseases and conditions ruled out: Secondary | ICD-10-CM | POA: Diagnosis not present

## 2024-01-13 DIAGNOSIS — A4151 Sepsis due to Escherichia coli [E. coli]: Secondary | ICD-10-CM | POA: Diagnosis present

## 2024-01-13 DIAGNOSIS — Z87440 Personal history of urinary (tract) infections: Secondary | ICD-10-CM | POA: Diagnosis not present

## 2024-01-13 DIAGNOSIS — E871 Hypo-osmolality and hyponatremia: Secondary | ICD-10-CM | POA: Diagnosis present

## 2024-01-13 DIAGNOSIS — E861 Hypovolemia: Secondary | ICD-10-CM | POA: Diagnosis present

## 2024-01-13 DIAGNOSIS — N200 Calculus of kidney: Secondary | ICD-10-CM | POA: Diagnosis not present

## 2024-01-13 DIAGNOSIS — Z79890 Hormone replacement therapy (postmenopausal): Secondary | ICD-10-CM | POA: Diagnosis not present

## 2024-01-13 DIAGNOSIS — Z85528 Personal history of other malignant neoplasm of kidney: Secondary | ICD-10-CM | POA: Diagnosis not present

## 2024-01-13 DIAGNOSIS — E782 Mixed hyperlipidemia: Secondary | ICD-10-CM | POA: Diagnosis present

## 2024-01-13 DIAGNOSIS — Z91048 Other nonmedicinal substance allergy status: Secondary | ICD-10-CM | POA: Diagnosis not present

## 2024-01-13 DIAGNOSIS — Z885 Allergy status to narcotic agent status: Secondary | ICD-10-CM | POA: Diagnosis not present

## 2024-01-13 DIAGNOSIS — R6520 Severe sepsis without septic shock: Secondary | ICD-10-CM | POA: Diagnosis present

## 2024-01-13 DIAGNOSIS — Z9049 Acquired absence of other specified parts of digestive tract: Secondary | ICD-10-CM | POA: Diagnosis not present

## 2024-01-13 DIAGNOSIS — Z905 Acquired absence of kidney: Secondary | ICD-10-CM | POA: Diagnosis not present

## 2024-01-13 DIAGNOSIS — R509 Fever, unspecified: Secondary | ICD-10-CM | POA: Diagnosis not present

## 2024-01-13 DIAGNOSIS — Z7984 Long term (current) use of oral hypoglycemic drugs: Secondary | ICD-10-CM | POA: Diagnosis not present

## 2024-01-13 DIAGNOSIS — K5900 Constipation, unspecified: Secondary | ICD-10-CM | POA: Diagnosis present

## 2024-01-13 DIAGNOSIS — G894 Chronic pain syndrome: Secondary | ICD-10-CM | POA: Diagnosis present

## 2024-01-13 DIAGNOSIS — Z79899 Other long term (current) drug therapy: Secondary | ICD-10-CM | POA: Diagnosis not present

## 2024-01-13 DIAGNOSIS — Z90710 Acquired absence of both cervix and uterus: Secondary | ICD-10-CM | POA: Diagnosis not present

## 2024-01-13 DIAGNOSIS — E039 Hypothyroidism, unspecified: Secondary | ICD-10-CM | POA: Diagnosis present

## 2024-01-13 DIAGNOSIS — N3020 Other chronic cystitis without hematuria: Secondary | ICD-10-CM | POA: Diagnosis present

## 2024-01-13 LAB — COMPREHENSIVE METABOLIC PANEL WITH GFR
ALT: 12 U/L (ref 0–44)
AST: 16 U/L (ref 15–41)
Albumin: 4.2 g/dL (ref 3.5–5.0)
Alkaline Phosphatase: 102 U/L (ref 38–126)
Anion gap: 17 — ABNORMAL HIGH (ref 5–15)
BUN: 20 mg/dL (ref 6–20)
CO2: 20 mmol/L — ABNORMAL LOW (ref 22–32)
Calcium: 10.8 mg/dL — ABNORMAL HIGH (ref 8.9–10.3)
Chloride: 96 mmol/L — ABNORMAL LOW (ref 98–111)
Creatinine, Ser: 0.87 mg/dL (ref 0.44–1.00)
GFR, Estimated: >60 mL/min (ref >60)
Glucose, Bld: 161 mg/dL — ABNORMAL HIGH (ref 70–99)
Potassium: 4.3 mmol/L (ref 3.5–5.1)
Sodium: 133 mmol/L — ABNORMAL LOW (ref 135–145)
Total Bilirubin: 0.8 mg/dL (ref 0.0–1.2)
Total Protein: 7.8 g/dL (ref 6.5–8.1)

## 2024-01-13 LAB — URINALYSIS, W/ REFLEX TO CULTURE (INFECTION SUSPECTED)
Bacteria, UA: NONE SEEN
Bilirubin Urine: NEGATIVE
Glucose, UA: 150 mg/dL — AB
Hgb urine dipstick: NEGATIVE
Ketones, ur: 20 mg/dL — AB
Leukocytes,Ua: NEGATIVE
Nitrite: NEGATIVE
Protein, ur: 100 mg/dL — AB
Specific Gravity, Urine: 1.036 — ABNORMAL HIGH (ref 1.005–1.030)
pH: 6 (ref 5.0–8.0)

## 2024-01-13 LAB — BLOOD CULTURE ID PANEL (REFLEXED) - BCID2

## 2024-01-13 LAB — I-STAT CG4 LACTIC ACID, ED: Lactic Acid, Venous: 1.5 mmol/L (ref 0.5–1.9)

## 2024-01-13 LAB — CBC WITH DIFFERENTIAL/PLATELET
Abs Immature Granulocytes: 0.25 K/uL — ABNORMAL HIGH (ref 0.00–0.07)
Basophils Absolute: 0.1 K/uL (ref 0.0–0.1)
Basophils Relative: 0 %
Eosinophils Absolute: 0 K/uL (ref 0.0–0.5)
Eosinophils Relative: 0 %
HCT: 39.7 % (ref 36.0–46.0)
Hemoglobin: 13.4 g/dL (ref 12.0–15.0)
Immature Granulocytes: 1 %
Lymphocytes Relative: 3 %
Lymphs Abs: 0.8 K/uL (ref 0.7–4.0)
MCH: 33.4 pg (ref 26.0–34.0)
MCHC: 33.8 g/dL (ref 30.0–36.0)
MCV: 99 fL (ref 80.0–100.0)
Monocytes Absolute: 1.6 K/uL — ABNORMAL HIGH (ref 0.1–1.0)
Monocytes Relative: 6 %
Neutro Abs: 21.9 K/uL — ABNORMAL HIGH (ref 1.7–7.7)
Neutrophils Relative %: 90 %
Platelets: 299 K/uL (ref 150–400)
RBC: 4.01 MIL/uL (ref 3.87–5.11)
RDW: 11.9 % (ref 11.5–15.5)
WBC: 24.6 K/uL — ABNORMAL HIGH (ref 4.0–10.5)
nRBC: 0 % (ref 0.0–0.2)

## 2024-01-13 LAB — CBC
HCT: 33.7 % — ABNORMAL LOW (ref 36.0–46.0)
Hemoglobin: 11.2 g/dL — ABNORMAL LOW (ref 12.0–15.0)
MCH: 33.6 pg (ref 26.0–34.0)
MCHC: 33.2 g/dL (ref 30.0–36.0)
MCV: 101.2 fL — ABNORMAL HIGH (ref 80.0–100.0)
Platelets: 221 K/uL (ref 150–400)
RBC: 3.33 MIL/uL — ABNORMAL LOW (ref 3.87–5.11)
RDW: 12 % (ref 11.5–15.5)
WBC: 13.4 K/uL — ABNORMAL HIGH (ref 4.0–10.5)
nRBC: 0 % (ref 0.0–0.2)

## 2024-01-13 LAB — BASIC METABOLIC PANEL WITH GFR
Anion gap: 12 (ref 5–15)
BUN: 17 mg/dL (ref 6–20)
CO2: 20 mmol/L — ABNORMAL LOW (ref 22–32)
Calcium: 9.1 mg/dL (ref 8.9–10.3)
Chloride: 99 mmol/L (ref 98–111)
Creatinine, Ser: 0.86 mg/dL (ref 0.44–1.00)
GFR, Estimated: >60 mL/min (ref >60)
Glucose, Bld: 199 mg/dL — ABNORMAL HIGH (ref 70–99)
Potassium: 3.8 mmol/L (ref 3.5–5.1)
Sodium: 132 mmol/L — ABNORMAL LOW (ref 135–145)

## 2024-01-13 LAB — PROTIME-INR
INR: 1.1 (ref 0.8–1.2)
Prothrombin Time: 14.7 s (ref 11.4–15.2)

## 2024-01-13 LAB — RESP PANEL BY RT-PCR (RSV, FLU A&B, COVID)  RVPGX2
Influenza A by PCR: NEGATIVE
Influenza B by PCR: NEGATIVE
Resp Syncytial Virus by PCR: NEGATIVE
SARS Coronavirus 2 by RT PCR: NEGATIVE

## 2024-01-13 LAB — LIPASE, BLOOD: Lipase: 18 U/L (ref 11–51)

## 2024-01-13 LAB — HEMOGLOBIN A1C
Hgb A1c MFr Bld: 4.8 % (ref 4.8–5.6)
Mean Plasma Glucose: 91.06 mg/dL

## 2024-01-13 LAB — PHOSPHORUS: Phosphorus: 2.2 mg/dL — ABNORMAL LOW (ref 2.5–4.6)

## 2024-01-13 LAB — HIV ANTIBODY (ROUTINE TESTING W REFLEX): HIV Screen 4th Generation wRfx: NONREACTIVE

## 2024-01-13 LAB — MAGNESIUM: Magnesium: 1.2 mg/dL — ABNORMAL LOW (ref 1.7–2.4)

## 2024-01-13 MED ORDER — ACETAMINOPHEN 500 MG PO TABS
500.0000 mg | ORAL_TABLET | Freq: Four times a day (QID) | ORAL | Status: AC | PRN
  Administered 2024-01-14 – 2024-01-15 (×4): 500 mg via ORAL
  Filled 2024-01-13 (×4): qty 1

## 2024-01-13 MED ORDER — MELATONIN 5 MG PO TABS
5.0000 mg | ORAL_TABLET | Freq: Every evening | ORAL | Status: DC | PRN
  Filled 2024-01-13: qty 1

## 2024-01-13 MED ORDER — HYDROMORPHONE HCL 1 MG/ML IJ SOLN
1.0000 mg | INTRAMUSCULAR | Status: AC
  Administered 2024-01-13: 04:00:00 1 mg via INTRAVENOUS
  Filled 2024-01-13: qty 1

## 2024-01-13 MED ORDER — SODIUM CHLORIDE 0.9 % IV SOLN
1.0000 g | Freq: Three times a day (TID) | INTRAVENOUS | Status: DC
  Administered 2024-01-13: 04:00:00 1 g via INTRAVENOUS
  Filled 2024-01-13 (×2): qty 20

## 2024-01-13 MED ORDER — ONDANSETRON HCL 4 MG/2ML IJ SOLN
4.0000 mg | Freq: Four times a day (QID) | INTRAMUSCULAR | Status: DC | PRN
  Administered 2024-01-13 – 2024-01-14 (×3): 4 mg via INTRAVENOUS
  Filled 2024-01-13 (×3): qty 2

## 2024-01-13 MED ORDER — SODIUM CHLORIDE 0.9 % IV SOLN
12.5000 mg | Freq: Four times a day (QID) | INTRAVENOUS | Status: DC | PRN
  Administered 2024-01-13 – 2024-01-15 (×8): 12.5 mg via INTRAVENOUS
  Filled 2024-01-13 (×6): qty 12.5
  Filled 2024-01-13: qty 0.5
  Filled 2024-01-13 (×2): qty 12.5

## 2024-01-13 MED ORDER — MORPHINE SULFATE (PF) 2 MG/ML IV SOLN: 2.0000 mg | INTRAVENOUS | Status: DC | PRN

## 2024-01-13 MED ORDER — LIDOCAINE VISCOUS HCL 2 % MT SOLN
15.0000 mL | Freq: Once | OROMUCOSAL | Status: AC
  Administered 2024-01-13: 04:00:00 15 mL via ORAL
  Filled 2024-01-13: qty 15

## 2024-01-13 MED ORDER — OXYCODONE HCL 5 MG PO TABS
5.0000 mg | ORAL_TABLET | ORAL | Status: AC | PRN
  Administered 2024-01-14 – 2024-01-15 (×6): 10 mg via ORAL
  Filled 2024-01-13 (×7): qty 2

## 2024-01-13 MED ORDER — LACTATED RINGERS IV BOLUS (SEPSIS)
1000.0000 mL | Freq: Once | INTRAVENOUS | Status: AC
  Administered 2024-01-13: 02:00:00 1000 mL via INTRAVENOUS

## 2024-01-13 MED ORDER — LACTATED RINGERS IV BOLUS (SEPSIS)
1000.0000 mL | Freq: Once | INTRAVENOUS | Status: AC
  Administered 2024-01-13: 03:00:00 1000 mL via INTRAVENOUS

## 2024-01-13 MED ORDER — VANCOMYCIN HCL IN DEXTROSE 1-5 GM/200ML-% IV SOLN
1000.0000 mg | Freq: Once | INTRAVENOUS | Status: AC
  Administered 2024-01-13: 03:00:00 1000 mg via INTRAVENOUS
  Filled 2024-01-13: qty 200

## 2024-01-13 MED ORDER — ACETAMINOPHEN 10 MG/ML IV SOLN
1000.0000 mg | Freq: Four times a day (QID) | INTRAVENOUS | Status: AC
  Administered 2024-01-13 (×4): 1000 mg via INTRAVENOUS
  Filled 2024-01-13 (×4): qty 100

## 2024-01-13 MED ORDER — LACTATED RINGERS IV BOLUS (SEPSIS)
500.0000 mL | Freq: Once | INTRAVENOUS | Status: AC
  Administered 2024-01-13: 04:00:00 500 mL via INTRAVENOUS

## 2024-01-13 MED ORDER — IOHEXOL 300 MG/ML  SOLN
100.0000 mL | Freq: Once | INTRAMUSCULAR | Status: AC | PRN
  Administered 2024-01-13: 01:00:00 100 mL via INTRAVENOUS

## 2024-01-13 MED ORDER — ALUM & MAG HYDROXIDE-SIMETH 200-200-20 MG/5ML PO SUSP
30.0000 mL | Freq: Once | ORAL | Status: AC
  Administered 2024-01-13: 04:00:00 30 mL via ORAL
  Filled 2024-01-13: qty 30

## 2024-01-13 MED ORDER — ENOXAPARIN SODIUM 40 MG/0.4ML IJ SOSY
40.0000 mg | PREFILLED_SYRINGE | INTRAMUSCULAR | Status: DC
  Administered 2024-01-13 – 2024-01-16 (×4): 40 mg via SUBCUTANEOUS
  Filled 2024-01-13 (×4): qty 0.4

## 2024-01-13 MED ORDER — THYROID 60 MG PO TABS
120.0000 mg | ORAL_TABLET | Freq: Every day | ORAL | Status: DC
  Administered 2024-01-13: 06:00:00 120 mg via ORAL
  Administered 2024-01-14: 10:00:00 90 mg via ORAL
  Administered 2024-01-15 – 2024-01-16 (×2): 120 mg via ORAL
  Filled 2024-01-13 (×4): qty 2

## 2024-01-13 MED ORDER — PROCHLORPERAZINE EDISYLATE 10 MG/2ML IJ SOLN
5.0000 mg | Freq: Four times a day (QID) | INTRAMUSCULAR | Status: DC | PRN
  Administered 2024-01-13: 08:00:00 5 mg via INTRAVENOUS
  Filled 2024-01-13: qty 2

## 2024-01-13 MED ORDER — SODIUM CHLORIDE 0.9 % IV SOLN
2.0000 g | Freq: Once | INTRAVENOUS | Status: AC
  Administered 2024-01-13: 03:00:00 2 g via INTRAVENOUS
  Filled 2024-01-13: qty 12.5

## 2024-01-13 MED ORDER — HYDROMORPHONE HCL 1 MG/ML IJ SOLN
0.5000 mg | Freq: Once | INTRAMUSCULAR | Status: AC
  Administered 2024-01-13: 0.5 mg via INTRAVENOUS
  Filled 2024-01-13: qty 1

## 2024-01-13 MED ORDER — SODIUM CHLORIDE 0.9 % IV SOLN
2.0000 g | Freq: Every day | INTRAVENOUS | Status: DC
  Administered 2024-01-13 – 2024-01-16 (×4): 2 g via INTRAVENOUS
  Filled 2024-01-13 (×4): qty 20

## 2024-01-13 MED ORDER — MAGNESIUM SULFATE 4 GM/100ML IV SOLN
4.0000 g | Freq: Once | INTRAVENOUS | Status: AC
  Administered 2024-01-13: 14:00:00 4 g via INTRAVENOUS
  Filled 2024-01-13: qty 100

## 2024-01-13 MED ORDER — VANCOMYCIN HCL 1750 MG/350ML IV SOLN: 1750.0000 mg | INTRAVENOUS | Status: DC

## 2024-01-13 MED ORDER — ONDANSETRON HCL 4 MG/2ML IJ SOLN
4.0000 mg | Freq: Once | INTRAMUSCULAR | Status: AC
  Administered 2024-01-13: 4 mg via INTRAVENOUS
  Filled 2024-01-13: qty 2

## 2024-01-13 MED ORDER — HYDROMORPHONE HCL 1 MG/ML IJ SOLN
0.5000 mg | INTRAMUSCULAR | Status: AC | PRN
  Administered 2024-01-13 – 2024-01-14 (×6): 0.5 mg via INTRAVENOUS
  Filled 2024-01-13: qty 1
  Filled 2024-01-13: qty 0.5
  Filled 2024-01-13 (×4): qty 1

## 2024-01-13 MED ORDER — HYDROMORPHONE HCL 1 MG/ML IJ SOLN
0.5000 mg | Freq: Once | INTRAMUSCULAR | Status: AC
  Administered 2024-01-13: 03:00:00 0.5 mg via INTRAVENOUS
  Filled 2024-01-13: qty 1

## 2024-01-13 MED ORDER — LACTATED RINGERS IV SOLN: INTRAVENOUS | Status: AC

## 2024-01-13 MED ORDER — SENNOSIDES-DOCUSATE SODIUM 8.6-50 MG PO TABS
2.0000 | ORAL_TABLET | Freq: Two times a day (BID) | ORAL | Status: AC
  Administered 2024-01-13 – 2024-01-14 (×2): 2 via ORAL
  Filled 2024-01-13 (×3): qty 2

## 2024-01-13 MED ORDER — POLYETHYLENE GLYCOL 3350 17 G PO PACK
17.0000 g | PACK | Freq: Every day | ORAL | Status: DC | PRN
  Administered 2024-01-14 – 2024-01-15 (×2): 17 g via ORAL
  Filled 2024-01-13 (×2): qty 1

## 2024-01-13 MED ORDER — METRONIDAZOLE 500 MG/100ML IV SOLN
500.0000 mg | Freq: Once | INTRAVENOUS | Status: AC
  Administered 2024-01-13: 02:00:00 500 mg via INTRAVENOUS
  Filled 2024-01-13: qty 100

## 2024-01-13 NOTE — ED Notes (Signed)
 Pt moved over to hospital bed to increase comfort. Pt able to stand with standby assist.  Pt tolerated well.  Repositioned self.  Remains on continuous cardiac, pulse ox and bp monitoring. No respiratory distress noted.

## 2024-01-13 NOTE — H&P (Addendum)
 History and Physical  [name] FMW:[id number] DOB: [birth year] DOA: 01/12/2024  Referring physician: Vicky Charleston, PA-EDP  PCP: System, Provider Not In  Outpatient Specialists: Urology, Dr. Selma. Patient coming from: Home.  Chief Complaint: Right-sided lower back pain, fevers, chills.  HPI: [name] is a 53 y.o. female with medical history significant for multiple episodes of UTIs, chronic cystitis due to ESBL producing E. coli (2022), urolithiasis, mixed hyperlipidemia, hypothyroidism, right renal cell carcinoma status post right robotic assisted lap partial nephrectomy (2006), GERD, status post gastric fundoplication (2022), status post hysterectomy, who presented to the ER with complaints of lower back pain associated with subjective fevers, 103 F at home, and chills.  States she is unable to truly vomit due to gastric fundoplication.  In the ER, low-grade fever 99.6, tachycardic with heart rate in the 140s, tachypneic with respiration rate 22.  Lab studies notable for leukocytosis 24.6 K, neutrophilia 21.9 and bandemia.    CT abdomen and pelvis with contrast revealed patchy decreased enhancement throughout the right kidney, suspicious for pyelonephritis.  Correlate with urinalysis and consider clinical treatment and follow-up as indicated.  Nonobstructing right nephrolithiasis.  Code sepsis was called in the ER.  Peripheral blood cultures x 2 and urine culture were ordered.  The patient was started on IV vancomycin , cefepime , and IV Flagyl .  She also received analgesics and IV fluid per sepsis protocol.  Admitted by [facility], hospitalist service.  ED Course: Temperature 99.6.  BP 127/74, pulse 141, respiration rate 22, O2 saturation 99% on room air.  Review of Systems: Review of systems as noted in the HPI. All other systems reviewed and are negative.   Past Medical History:  Diagnosis Date   Anemia    Asthma    allergy    Family history of adverse reaction to anesthesia     GERD (gastroesophageal reflux disease)    Hypothyroidism    PCOS (polycystic ovarian syndrome)    Renal cell cancer [facility])    Past Surgical History:  Procedure Laterality Date   ABDOMINAL HYSTERECTOMY     APPENDECTOMY     CHOLECYSTECTOMY     CYSTOSCOPY/URETEROSCOPY/HOLMIUM LASER/STENT PLACEMENT Right 10/09/2019   Procedure: CYSTOSCOPY/RETROGRADE/URETEROSCOPY/HOLMIUM LASER/STENT PLACEMENT;  Surgeon: Selma Donnice SAUNDERS, MD;  Location: [facility] ORS;  Service: Urology;  Laterality: Right;   ELBOW SURGERY     HEMORRHOID SURGERY     KIDNEY SURGERY      Social History:  reports that she has never smoked. She has never used smokeless tobacco. She reports that she does not drink alcohol and does not use drugs.   Allergies[1]  Family history: None reported.  Prior to Admission medications  Medication Sig Start Date End Date Taking? Authorizing Provider  albuterol (VENTOLIN HFA) 108 (90 Base) MCG/ACT inhaler Inhale 1-2 puffs into the lungs every 6 (six) hours as needed for wheezing or shortness of breath.    [provider]  ALLEGRA-D ALLERGY & CONGESTION 60-120 MG 12 hr tablet Take 1 tablet by mouth daily. 06/09/19   [provider]  Ascorbic Acid (VITAMIN C) 1000 MG tablet Take 1,000 mg by mouth daily.    [provider]  ASHWAGANDHA PO Take 1 g by mouth daily.    [provider]  Azelastine HCl 137 MCG/SPRAY SOLN Place 1 spray into both nostrils 2 (two) times daily. 09/24/19   [provider]  Biotin w/ Vitamins C & E (HAIR/SKIN/NAILS PO) Take 2 tablets by mouth daily.    [provider]  NETTA  CURRANT SEED OIL PO Take 1,000 mg by mouth daily.    [provider]  Cholecalciferol (VITAMIN D3) 125 MCG (5000 UT) TABS Take 5,000 Units by mouth daily.    [provider]  esomeprazole (NEXIUM) 20 MG capsule Take 20 mg by mouth every evening.    [provider]  estradiol  (ESTRACE ) 1 MG tablet Take 1 mg by mouth every evening.  09/15/19   [provider]  EVENING PRIMROSE OIL PO Take 1,300 mg by mouth daily.    [provider]  Lactobacillus (DIGESTIVE HEALTH PROBIOTIC PO) Take 1 capsule by mouth daily.    [provider]  Lidocaine -Hydrocortisone Ace 3-0.5 % CREA Place 1 application rectally as needed (discomfort/hemmorroids.).    [provider]  linaclotide (LINZESS) 145 MCG CAPS capsule Take 145 mcg by mouth daily before breakfast.    [provider]  MAGNESIUM  PO Take 210 mg by mouth at bedtime.    [provider]  metFORMIN (GLUCOPHAGE-XR) 500 MG 24 hr tablet Take 500 mg by mouth 2 (two) times daily. 05/22/19   [provider]  Misc Natural Products (SUPER GREENS PO) Take 1 Scoop by mouth daily.    [provider]  montelukast (SINGULAIR) 10 MG tablet Take 10 mg by mouth at bedtime.  04/21/19   [provider]  mupirocin ointment (BACTROBAN) 2 % Place 1 application into the nose in the morning and at bedtime. 09/23/19   [provider]  NP THYROID  120 MG tablet Take 120 mg by mouth daily before breakfast. 09/16/19   [provider]  Omega-3 Fatty Acids (FISH OIL) 1000 MG CAPS Take 1,000 mg by mouth daily.    [provider]  omeprazole (PRILOSEC) 20 MG capsule Take 20 mg by mouth daily.    [provider]  ondansetron  (ZOFRAN ) 8 MG tablet Take 8 mg by mouth every 8 (eight) hours as needed for nausea/vomiting. 09/18/19   [provider]  oxyCODONE -acetaminophen  (PERCOCET) 5-325 MG tablet Take 1 tablet by mouth every 4 (four) hours as needed for up to 8 doses for severe pain. 10/09/19   Selma Donnice SAUNDERS, MD  progesterone  (PROMETRIUM ) 200 MG capsule Take 400 mg by mouth at bedtime. 09/15/19   [provider]  promethazine  (PHENERGAN ) 25 MG tablet Take 25 mg by mouth every 6 (six) hours as needed for nausea or vomiting.    [provider]  spironolactone  (ALDACTONE ) 100 MG tablet Take 100  mg by mouth 2 (two) times daily. 09/14/19   [provider]  tamsulosin (FLOMAX) 0.4 MG CAPS capsule Take 0.4 mg by mouth daily in the afternoon. 09/24/19   [provider]  TURMERIC PO Take 1,000 mg by mouth daily.    [provider]  vitamin B-12 (CYANOCOBALAMIN ) 1000 MCG tablet Take 1,000 mcg by mouth daily.    [provider]  Zinc 50 MG TABS Take 50 mg by mouth daily.    [provider]    Physical Exam: BP 127/74   Pulse (!) 141   Temp 99.6 F (37.6 C) (Oral)   Resp 20   SpO2 99%   General: 53 y.o. year-old female well developed well nourished in no acute distress.  Alert and oriented x3. Cardiovascular: Regular rate and rhythm with no rubs or gallops.  No thyromegaly or JVD noted.  No lower extremity edema. 2/4 pulses in all 4 extremities. Respiratory: Clear to auscultation with no wheezes or rales. Good inspiratory effort. Abdomen: Soft, epigastric  tenderness with mild palpation, nondistended with normal bowel sounds x4 quadrants.  Right flank tenderness. Muskuloskeletal: No cyanosis, clubbing or edema noted bilaterally Neuro: CN II-XII intact, strength, sensation, reflexes Skin: No ulcerative lesions noted or rashes Psychiatry: Judgement and insight appear normal. Mood is appropriate for condition and setting          Labs on Admission:  Basic Metabolic Panel: Recent Labs  Lab 01/13/24 0020  NA 133*  K 4.3  CL 96*  CO2 20*  GLUCOSE 161*  BUN 20  CREATININE 0.87  CALCIUM 10.8*   Liver Function Tests: Recent Labs  Lab 01/13/24 0020  AST 16  ALT 12  ALKPHOS 102  BILITOT 0.8  PROT 7.8  ALBUMIN 4.2   Recent Labs  Lab 01/13/24 0020  LIPASE 18   No results for input(s): AMMONIA in the last 168 hours. CBC: Recent Labs  Lab 01/13/24 0020  WBC 24.6*  NEUTROABS 21.9*  HGB 13.4  HCT 39.7  MCV 99.0  PLT 299   Cardiac Enzymes: No results for input(s): CKTOTAL, CKMB, CKMBINDEX, TROPONINI in the last  168 hours.  BNP (last 3 results) No results for input(s): BNP in the last 8760 hours.  ProBNP (last 3 results) No results for input(s): PROBNP in the last 8760 hours.  CBG: No results for input(s): GLUCAP in the last 168 hours.  Radiological Exams on Admission: CT ABDOMEN PELVIS W CONTRAST Result Date: 01/13/2024 EXAM: CT ABDOMEN AND PELVIS WITH CONTRAST 01/13/2024 01:34:25 AM TECHNIQUE: CT of the abdomen and pelvis was performed with the administration of 100 mL of iohexol  (OMNIPAQUE ) 300 MG/ML solution. Multiplanar reformatted images are provided for review. Automated exposure control, iterative reconstruction, and/or weight-based adjustment of the mA/kV was utilized to reduce the radiation dose to as low as reasonably achievable. COMPARISON: 06/12/2019 CLINICAL HISTORY: Back pain and fevers for 1 week. FINDINGS: LOWER CHEST: Lung bases are clear. LIVER: The liver is unremarkable. GALLBLADDER AND BILE DUCTS: Gallbladder has been surgically removed. No biliary ductal dilatation. SPLEEN: No acute abnormality. PANCREAS: No acute abnormality. ADRENAL GLANDS: No acute abnormality. KIDNEYS, URETERS AND BLADDER: The left kidney demonstrates a normal enhancement pattern. The right kidney demonstrates patchy decreased enhancement throughout. Diffuse scattered calcifications are noted, consistent with nonobstructing calculi. No obstructive changes are seen. The bladder is within normal limits. GI AND BOWEL: Stomach demonstrates no acute abnormality. Small bowel is unremarkable. Colon is within normal limits. The appendix is not well visualized, consistent with the prior surgical history. There is no bowel obstruction. PERITONEUM AND RETROPERITONEUM: No ascites. No free air. VASCULATURE: Aorta is normal in caliber. LYMPH NODES: No lymphadenopathy. REPRODUCTIVE ORGANS: The uterus has been surgically removed. BONES AND SOFT TISSUES: No acute osseous abnormality. No focal soft tissue abnormality. IMPRESSION:  1. Patchy decreased enhancement throughout the right kidney, suspicious for pyelonephritis. Correlate with urinalysis and consider clinical treatment and follow-up as indicated. 2. Nonobstructing right nephrolithiasis. Electronically signed by: Oneil Devonshire MD 01/13/2024 01:52 AM EST RP Workstation: [id number]   DG Chest Port 1 View Result Date: 01/13/2024 EXAM: 1 VIEW XRAY OF THE CHEST 01/13/2024 12:17:00 AM COMPARISON: None available. CLINICAL HISTORY: Questionable sepsis - evaluate for abnormality FINDINGS: LUNGS AND PLEURA: No focal pulmonary opacity. No pleural effusion. No pneumothorax. HEART AND MEDIASTINUM: No acute abnormality of the cardiac and mediastinal silhouettes. BONES AND SOFT TISSUES: Mild thoracic scoliosis. IMPRESSION: 1. No acute cardiopulmonary abnormality. 2. Mild thoracic scoliosis. Electronically signed by: Oneil Devonshire MD 01/13/2024 12:20 AM EST RP Workstation: [id number]  EKG: I independently viewed the EKG done and my findings are as followed: Sinus tachycardia rate of 126.  QTc 447.  Assessment/Plan Present on Admission:  Pyelonephritis  Principal Problem:   Pyelonephritis  Severe sepsis secondary to right pyelonephritis, POA History of chronic cystitis with ESBL E. coli Presented with leukocytosis 24.6 K, tachycardia 141, tachypnea 22, findings on CT scan suggestive of right pyelonephritis and nonobstructing right nephrolithiasis. Continue empiric IV antibiotics Add meropenem , continue IV vancomycin . Continue pain control IV fluid hydration  Non-obstructing right nephrolithiasis, POA. Continue IV fluid hydration Monitor urine output Consider urology consultation in the morning.  Hyperglycemia No prior history of diabetes Presented with serum glucose of 161, possibly reactive due to sepsis.   Follow A1c Heart healthy carb modified diet.  Hypovolemic hyponatremia Serum sodium 131 Encourage oral intake Currently on IV fluid per sepsis protocol LR at  150 cc/h x 20 hours. Repeat BMP in the morning.  Mild anion gap metabolic acidosis Serum bicarb 20, anion gap 17 Continue IV fluid hydration with LR Repeat BMP in the morning  Hypocalcemia Serum calcium 10.8 Continue IV fluid hydration  History of gastric fundoplication with abdominal pain States GI cocktail and PPI typically help her abdominal pain, ordered. Advised to completely avoid NSAIDs. The patient denies melena or hematochezia. Hemoglobin stable at 13.4 K Recommend close follow-up with GI outpatient.  Hypothyroidism Resume home regimen.  Constipation No bowel movements in 3 days Bowel regimen in place.   Critical care time: 55 minutes.   DVT prophylaxis: Subcu Lovenox  daily.  Code Status: Full code.  Family Communication: None at bedside.  Disposition Plan: Admitted to telemetry unit.  Consults called: None.  Admission status: Inpatient status.   Status is: Inpatient The patient requires at least 2 midnights for further evaluation and treatment of present condition.   Terry LOISE Hurst MD [facility] Pager [phone]  If 7PM-7AM, please contact night-coverage [email or web address] Password [facility]  01/13/2024, 2:33 AM      [1]  Allergies Allergen Reactions   Tramadol  Other (See Comments)    Severe Headaches Severe Headaches    Adhesive [Tape] Rash

## 2024-01-13 NOTE — Progress Notes (Signed)
 PHARMACY - PHYSICIAN COMMUNICATION CRITICAL VALUE ALERT - BLOOD CULTURE IDENTIFICATION (BCID)  [name] is an 53 y.o. female who presented to [facility] on 01/12/2024 with sepsis, pyelonephritis.  PMH is significant for recurrent UTI.   Assessment:   12/15 BCx: GNR in 4/4 bottles 12/15 BCID: Ecoli, no ESBL resistance   Name of physician (or Provider) Contacted: Dr Raenelle  Current antibiotics: Ceftriaxone  2g IV q24h  Changes to prescribed antibiotics recommended:  Patient is on recommended antibiotics - No changes needed  Results for orders placed or performed during the hospital encounter of 01/12/24  Blood Culture ID Panel (Reflexed) (Collected: 01/13/2024  1:45 AM)  Result Value Ref Range   Enterococcus faecalis NOT DETECTED NOT DETECTED   Enterococcus Faecium NOT DETECTED NOT DETECTED   Listeria monocytogenes NOT DETECTED NOT DETECTED   Staphylococcus species NOT DETECTED NOT DETECTED   Staphylococcus aureus (BCID) NOT DETECTED NOT DETECTED   Staphylococcus epidermidis NOT DETECTED NOT DETECTED   Staphylococcus lugdunensis NOT DETECTED NOT DETECTED   Streptococcus species NOT DETECTED NOT DETECTED   Streptococcus agalactiae NOT DETECTED NOT DETECTED   Streptococcus pneumoniae NOT DETECTED NOT DETECTED   Streptococcus pyogenes NOT DETECTED NOT DETECTED   A.calcoaceticus-baumannii NOT DETECTED NOT DETECTED   Bacteroides fragilis NOT DETECTED NOT DETECTED   Enterobacterales DETECTED (A) NOT DETECTED   Enterobacter cloacae complex NOT DETECTED NOT DETECTED   Escherichia coli DETECTED (A) NOT DETECTED   Klebsiella aerogenes NOT DETECTED NOT DETECTED   Klebsiella oxytoca NOT DETECTED NOT DETECTED   Klebsiella pneumoniae NOT DETECTED NOT DETECTED   Proteus species NOT DETECTED NOT DETECTED   Salmonella species NOT DETECTED NOT DETECTED   Serratia marcescens NOT DETECTED NOT DETECTED   Haemophilus influenzae NOT DETECTED NOT DETECTED   Neisseria meningitidis NOT DETECTED  NOT DETECTED   Pseudomonas aeruginosa NOT DETECTED NOT DETECTED   Stenotrophomonas maltophilia NOT DETECTED NOT DETECTED   Candida albicans NOT DETECTED NOT DETECTED   Candida auris NOT DETECTED NOT DETECTED   Candida glabrata NOT DETECTED NOT DETECTED   Candida krusei NOT DETECTED NOT DETECTED   Candida parapsilosis NOT DETECTED NOT DETECTED   Candida tropicalis NOT DETECTED NOT DETECTED   Cryptococcus neoformans/gattii NOT DETECTED NOT DETECTED   CTX-M ESBL NOT DETECTED NOT DETECTED   Carbapenem resistance IMP NOT DETECTED NOT DETECTED   Carbapenem resistance KPC NOT DETECTED NOT DETECTED   Carbapenem resistance NDM NOT DETECTED NOT DETECTED   Carbapenem resist OXA 48 LIKE NOT DETECTED NOT DETECTED   Carbapenem resistance VIM NOT DETECTED NOT DETECTED    Wanda Hasting PharmD, BCPS [facility] main pharmacy [phone] 01/13/2024 4:23 PM

## 2024-01-13 NOTE — ED Provider Notes (Signed)
 [facility]EMERGENCY DEPT [facility] Emergency Department Provider Note MRN:  [id number]  Arrival date & time: 01/13/2024     Chief Complaint   Fever, Back Pain, and Shaking   History of Present Illness   [name] is a 53 y.o. year-old female presents to the ED with chief complaint of generalized bodyaches, fevers, chills, low back pain that radiates up the side.  She reports associated headache.  Back pain started about a week ago, the fevers and chills and generalized bodyaches started yesterday.  She reports fevers to 103.  She reports associated nausea and vomiting.  States that this feels somewhat similar to when she has had kidney infections and kidney stones in the past.  She has not been able to tolerate oral intake.  She denies any productive cough, but states that she has been gagging anytime she tries to swallow anything.  She denies any successful treatments prior to arrival.  Denies known sick contacts.  History of renal cell cancer (years ago), but is in remission from this..  History provided by patient.   Review of Systems  Pertinent positive and negative review of systems noted in HPI.    Physical Exam   Vitals:   01/12/24 2351  BP: 127/74  Pulse: (!) 141  Resp: 20  Temp: 99.6 F (37.6 C)  SpO2: 99%    CONSTITUTIONAL:  uncomfortable-appearing, NAD NEURO:  Alert and oriented x 3, CN 3-12 grossly intact EYES:  eyes equal and reactive ENT/NECK:  Supple, no stridor  CARDIO:  tachycardic, regular rhythm, appears well-perfused  PULM:  No respiratory distress, CTAB GI/GU:  non-distended, generalized lower abdominal tenderness MSK/SPINE:  No gross deformities, no edema, moves all extremities  SKIN:  no rash, , atraumatic   *Additional and/or pertinent findings included in MDM below  Diagnostic and Interventional Summary    EKG Interpretation Date/Time:  Monday January 13 2024 00:00:00 EST Ventricular Rate:  136 PR Interval:  105 QRS Duration:  83 QT  Interval:  297 QTC Calculation: 447 R Axis:   91  Text Interpretation: Sinus tachycardia Consider right atrial enlargement , new Borderline right axis deviation , new Borderline repolarization abnormality , possibly rate related Baseline wander in lead(s) V1 Confirmed by Rogelia Satterfield ([id number]) on 01/13/2024 12:03:45 AM       Labs Reviewed  COMPREHENSIVE METABOLIC PANEL WITH GFR - Abnormal; Notable for the following components:      Result Value   Sodium 133 (*)    Chloride 96 (*)    CO2 20 (*)    Glucose, Bld 161 (*)    Calcium 10.8 (*)    Anion gap 17 (*)    All other components within normal limits  CBC WITH DIFFERENTIAL/PLATELET - Abnormal; Notable for the following components:   WBC 24.6 (*)    Neutro Abs 21.9 (*)    Monocytes Absolute 1.6 (*)    Abs Immature Granulocytes 0.25 (*)    All other components within normal limits  RESP PANEL BY RT-PCR (RSV, FLU A&B, COVID)  [id number]  CULTURE, BLOOD (ROUTINE X 2)  CULTURE, BLOOD (ROUTINE X 2)  LIPASE, BLOOD  PROTIME-INR  URINALYSIS, W/ REFLEX TO CULTURE (INFECTION SUSPECTED)  I-STAT CG4 LACTIC ACID, ED  I-STAT CG4 LACTIC ACID, ED    CT ABDOMEN PELVIS W CONTRAST  Final Result    DG Chest Port 1 View  Final Result      Medications  acetaminophen  (OFIRMEV ) IV 1,000 mg (0 mg Intravenous Stopped 01/13/24 0031)  lactated ringers  infusion (has no administration in time range)  lactated ringers  bolus 1,000 mL (1,000 mLs Intravenous New Bag/Given 01/13/24 0141)    And  lactated ringers  bolus 1,000 mL (has no administration in time range)    And  lactated ringers  bolus 500 mL (has no administration in time range)  ceFEPIme  (MAXIPIME ) 2 g in sodium chloride  0.9 % 100 mL IVPB (has no administration in time range)  metroNIDAZOLE  (FLAGYL ) IVPB 500 mg (500 mg Intravenous New Bag/Given 01/13/24 0144)  vancomycin  (VANCOCIN ) IVPB 1000 mg/200 mL premix (has no administration in time range)  HYDROmorphone  (DILAUDID ) injection 0.5 mg  (0.5 mg Intravenous Given 01/13/24 0016)  ondansetron  (ZOFRAN ) injection 4 mg (4 mg Intravenous Given 01/13/24 0016)  iohexol  (OMNIPAQUE ) 300 MG/ML solution 100 mL (100 mLs Intravenous Contrast Given 01/13/24 0120)     Procedures  /  Critical Care .Critical Care  Performed by: Vicky Charleston, PA-C Authorized by: Vicky Charleston, PA-C   Critical care provider statement:    Critical care time (minutes):  44   Critical care was necessary to treat or prevent imminent or life-threatening deterioration of the following conditions:  Sepsis   Critical care was time spent personally by me on the following activities:  Development of treatment plan with patient or surrogate, discussions with consultants, evaluation of patient's response to treatment, examination of patient, ordering and review of laboratory studies, ordering and review of radiographic studies, ordering and performing treatments and interventions, pulse oximetry, re-evaluation of patient's condition and review of old charts   ED Course and Medical Decision Making  I have reviewed the triage vital signs, the nursing notes, and pertinent available records from the EMR.  Social Determinants Affecting Complexity of Care: Patient has no clinically significant social determinants affecting this chief complaint..   ED Course: Clinical Course as of 01/13/24 0218  Mon Jan 13, 2024  0046 White count results of 24.6, this with her fever and rapid heart rate now make her positive for SIRS, will start antibiotics, continue fluids.  Remaining workup still pending. [RB]    Clinical Course User Index [RB] Vicky Charleston, PA-C    Medical Decision Making Patient here with generalized fevers, chills, low back pain that radiates up the side.  She has history of kidney stones and has history of kidney infection.  She reports fever to 103 at home.  She is noted to be tachycardic to the 140s in triage.  Mildly elevated temperature of  99.6.  Leukocytosis to 24, now meets SIRS criteria.  Will activate code sepsis.  Will start broad-spectrum antibiotics.  She is not hypotensive, but she is significantly tachycardic, so we will proceed with fluid resuscitation.  Lipase is normal, doubt pancreatitis.   normal creatinine, no evidence of AKI.  CT abdomen/pelvis notable for patchy decreased enhancement throughout the right kidney worrisome for pyelonephritis.  This does correspond with her symptoms and her presentation, we will continue treatment with broad-spectrum antibiotics.  Will admit to medicine.  Amount and/or Complexity of Data Reviewed Labs: ordered. Radiology: ordered. ECG/medicine tests: ordered.  Risk Prescription drug management. Decision regarding hospitalization.         Consultants: I consulted with Hospitalist, Dr. Shona, who is appreciated for admitting.   Treatment and Plan: Patient's exam and diagnostic results are concerning for sepsis 2/2 pyelonephritis.  Feel that patient will need admission to the hospital for further treatment and evaluation.    Final Clinical Impressions(s) / ED Diagnoses     ICD-10-CM   1.  Sepsis, due to unspecified organism, unspecified whether acute organ dysfunction present (HCC)  A41.9     2. Pyelonephritis  N12       ED Discharge Orders     None         Discharge Instructions Discussed with and Provided to Patient:   Discharge Instructions   None      Vicky Charleston, PA-C 01/13/24 0235    Rogelia Jerilynn RAMAN, MD 01/13/24 [phone]

## 2024-01-13 NOTE — ED Notes (Signed)
 First poc with patient. PT asleep.  Remains on continuous cardiac, pulse ox and bp monitoring.  Will reassess.  No respiratory distress noted currently

## 2024-01-13 NOTE — Progress Notes (Signed)
 Pharmacy Antibiotic Note  [name] is a 53 y.o. female admitted on 01/12/2024 with sepsis.  Pharmacy has been consulted for vanc dosing, 1st dose given in the ED  Plan: Vancomycin  1750mg  IV q24h (AUC 522, Scr 0.87) Follow renal function,cultures and clinical course  Height: 5' 6 (167.6 cm) Weight: 74.5 kg (164 lb 3.9 oz) IBW/kg (Calculated) : 59.3  Temp (24hrs), Avg:101.4 F (38.6 C), Min:99.6 F (37.6 C), Max:103.1 F (39.5 C)  Recent Labs  Lab 01/13/24 0016 01/13/24 0020  WBC  --  24.6*  CREATININE  --  0.87  LATICACIDVEN 1.5  --     Estimated Creatinine Clearance: 77.2 mL/min (by C-G formula based on SCr of 0.87 mg/dL).    Allergies[1]  Antimicrobials this admission 12/15 vanc >> 12/15 cefepime  x1 12/15 merrem  >> 12/15 flagyl  x 1   Dose adjustments this admission:   Microbiology results: 12/15 BCx:  Thank you for allowing pharmacy to be a part of this patients care.  Leeroy Mace RPh 01/13/2024, 4:58 AM     [1]  Allergies Allergen Reactions   Tramadol  Other (See Comments)    Severe Headaches Severe Headaches    Adhesive [Tape] Rash

## 2024-01-13 NOTE — Progress Notes (Signed)
 Patient seen and examined.  Admitted early morning hours by nighttime hospitalist.  See H&P assessment plan.  Following changes were done on antibiotics management.  In brief, 53 year old with multiple recurrent UTIs, chronic cystitis, urolithiasis, hypothyroidism, right renal cell cancer status post partial nephrectomy, gastric fundoplication, history of hysterectomy presented to the ER with right lower back pain, temperature 103 at home with chills.  In the emergency room temperature 99.6, tachycardic with heart rate 140, tachypneic with respiratory rate 22.  WC count 24.6.  UA was abnormal.  CT scan consistent with right parenchymal enhancement suspicious for pyelonephritis.  Blood cultures and urine cultures were drawn, patient was given vancomycin  cefepime  and Flagyl .  Patient seen and examined.  Still feels tired but slightly better than yesterday.  Complains of moderate right-sided abdominal pain mostly on the back and also in the right flank.  Urinating without difficulties.  On room air.  Sepsis present on admission, right-sided pyelonephritis: History of recurrent UTI. Blood cultures, negative so far Urine cultures, negative so far CT scan with no evidence of hydronephrosis, nonobstructive right nephrolithiasis present. Patient currently on vancomycin , meropenem .  Given clinical stability, will change to Rocephin  2 g daily.  Continue maintenance IV fluids.  Encourage oral intake.  Mobilize.   Same-day admit.  No charge visit.

## 2024-01-13 NOTE — Sepsis Progress Note (Signed)
 Elink following code sepsis

## 2024-01-14 LAB — CBC WITH DIFFERENTIAL/PLATELET
Abs Immature Granulocytes: 0.19 K/uL — ABNORMAL HIGH (ref 0.00–0.07)
Basophils Absolute: 0 K/uL (ref 0.0–0.1)
Basophils Relative: 0 %
Eosinophils Absolute: 0 K/uL (ref 0.0–0.5)
Eosinophils Relative: 0 %
HCT: 34.1 % — ABNORMAL LOW (ref 36.0–46.0)
Hemoglobin: 11.3 g/dL — ABNORMAL LOW (ref 12.0–15.0)
Immature Granulocytes: 1 %
Lymphocytes Relative: 5 %
Lymphs Abs: 0.9 K/uL (ref 0.7–4.0)
MCH: 33.5 pg (ref 26.0–34.0)
MCHC: 33.1 g/dL (ref 30.0–36.0)
MCV: 101.2 fL — ABNORMAL HIGH (ref 80.0–100.0)
Monocytes Absolute: 1.1 K/uL — ABNORMAL HIGH (ref 0.1–1.0)
Monocytes Relative: 6 %
Neutro Abs: 15.3 K/uL — ABNORMAL HIGH (ref 1.7–7.7)
Neutrophils Relative %: 88 %
Platelets: 215 K/uL (ref 150–400)
RBC: 3.37 MIL/uL — ABNORMAL LOW (ref 3.87–5.11)
RDW: 12.1 % (ref 11.5–15.5)
WBC: 17.5 K/uL — ABNORMAL HIGH (ref 4.0–10.5)
nRBC: 0 % (ref 0.0–0.2)

## 2024-01-14 LAB — BASIC METABOLIC PANEL WITH GFR
Anion gap: 12 (ref 5–15)
BUN: 12 mg/dL (ref 6–20)
CO2: 22 mmol/L (ref 22–32)
Calcium: 9.1 mg/dL (ref 8.9–10.3)
Chloride: 99 mmol/L (ref 98–111)
Creatinine, Ser: 0.68 mg/dL (ref 0.44–1.00)
GFR, Estimated: >60 mL/min (ref >60)
Glucose, Bld: 105 mg/dL — ABNORMAL HIGH (ref 70–99)
Potassium: 4.5 mmol/L (ref 3.5–5.1)
Sodium: 133 mmol/L — ABNORMAL LOW (ref 135–145)

## 2024-01-14 LAB — PHOSPHORUS: Phosphorus: 2.1 mg/dL — ABNORMAL LOW (ref 2.5–4.6)

## 2024-01-14 LAB — MAGNESIUM: Magnesium: 2 mg/dL (ref 1.7–2.4)

## 2024-01-14 MED ORDER — ALUM & MAG HYDROXIDE-SIMETH 200-200-20 MG/5ML PO SUSP
30.0000 mL | ORAL | Status: DC | PRN
  Administered 2024-01-15: 10:00:00 30 mL via ORAL
  Filled 2024-01-14: qty 30

## 2024-01-14 MED ORDER — SENNOSIDES-DOCUSATE SODIUM 8.6-50 MG PO TABS
2.0000 | ORAL_TABLET | Freq: Two times a day (BID) | ORAL | Status: AC
  Administered 2024-01-14 – 2024-01-15 (×3): 2 via ORAL
  Filled 2024-01-14 (×3): qty 2

## 2024-01-14 MED ORDER — DIPHENHYDRAMINE HCL 25 MG PO CAPS
25.0000 mg | ORAL_CAPSULE | Freq: Once | ORAL | Status: AC | PRN
  Administered 2024-01-14: 06:00:00 25 mg via ORAL
  Filled 2024-01-14: qty 1

## 2024-01-14 MED ORDER — ONDANSETRON HCL 4 MG/2ML IJ SOLN
4.0000 mg | Freq: Four times a day (QID) | INTRAMUSCULAR | Status: AC
  Administered 2024-01-14 – 2024-01-15 (×4): 4 mg via INTRAVENOUS
  Filled 2024-01-14 (×4): qty 2

## 2024-01-14 MED ORDER — TIZANIDINE HCL 4 MG PO TABS
4.0000 mg | ORAL_TABLET | Freq: Every day | ORAL | Status: DC | PRN
  Administered 2024-01-15: 21:00:00 4 mg via ORAL
  Filled 2024-01-14: qty 1

## 2024-01-14 MED ORDER — DIPHENHYDRAMINE HCL 25 MG PO CAPS
25.0000 mg | ORAL_CAPSULE | Freq: Three times a day (TID) | ORAL | Status: DC | PRN
  Administered 2024-01-14 – 2024-01-15 (×5): 25 mg via ORAL
  Filled 2024-01-14 (×7): qty 1

## 2024-01-14 MED ORDER — GABAPENTIN 300 MG PO CAPS
300.0000 mg | ORAL_CAPSULE | Freq: Three times a day (TID) | ORAL | Status: DC
  Administered 2024-01-14 – 2024-01-16 (×6): 300 mg via ORAL
  Filled 2024-01-14 (×7): qty 1

## 2024-01-14 MED ORDER — MUSCLE RUB 10-15 % EX CREA: TOPICAL_CREAM | CUTANEOUS | Status: DC | PRN

## 2024-01-14 MED ORDER — SODIUM CHLORIDE 0.9 % IV SOLN: INTRAVENOUS | Status: AC

## 2024-01-14 MED ORDER — VALACYCLOVIR HCL 500 MG PO TABS
1000.0000 mg | ORAL_TABLET | Freq: Two times a day (BID) | ORAL | Status: DC
  Administered 2024-01-14 – 2024-01-16 (×5): 1000 mg via ORAL
  Filled 2024-01-14 (×5): qty 2

## 2024-01-14 MED ORDER — LORAZEPAM 2 MG/ML IJ SOLN
0.5000 mg | Freq: Four times a day (QID) | INTRAMUSCULAR | Status: DC | PRN
  Administered 2024-01-14 – 2024-01-15 (×3): 0.5 mg via INTRAVENOUS
  Filled 2024-01-14 (×3): qty 1

## 2024-01-14 MED ORDER — ESTRADIOL 0.5 MG PO TABS
1.0000 mg | ORAL_TABLET | Freq: Every evening | ORAL | Status: DC
  Administered 2024-01-14 – 2024-01-15 (×2): 1 mg via ORAL
  Filled 2024-01-14 (×3): qty 2

## 2024-01-14 MED ORDER — TRAZODONE HCL 50 MG PO TABS
75.0000 mg | ORAL_TABLET | Freq: Every day | ORAL | Status: AC
  Administered 2024-01-14 – 2024-01-15 (×2): 75 mg via ORAL
  Filled 2024-01-14 (×2): qty 2

## 2024-01-14 MED ORDER — HYDROMORPHONE HCL 1 MG/ML IJ SOLN
0.5000 mg | INTRAMUSCULAR | Status: AC | PRN
  Administered 2024-01-14 – 2024-01-15 (×6): 0.5 mg via INTRAVENOUS
  Filled 2024-01-14 (×7): qty 0.5

## 2024-01-14 MED ORDER — SPIRONOLACTONE 100 MG PO TABS: 100.0000 mg | ORAL_TABLET | Freq: Every day | ORAL | Status: DC

## 2024-01-14 MED ORDER — DIPHENHYDRAMINE HCL 25 MG PO CAPS
25.0000 mg | ORAL_CAPSULE | Freq: Once | ORAL | Status: AC | PRN
  Administered 2024-01-14: 01:00:00 25 mg via ORAL
  Filled 2024-01-14: qty 1

## 2024-01-14 MED ORDER — SODIUM PHOSPHATES 45 MMOLE/15ML IV SOLN
15.0000 mmol | Freq: Once | INTRAVENOUS | Status: AC
  Administered 2024-01-14: 15:00:00 15 mmol via INTRAVENOUS
  Filled 2024-01-14: qty 5

## 2024-01-14 MED ORDER — PROGESTERONE MICRONIZED 100 MG PO CAPS
400.0000 mg | ORAL_CAPSULE | Freq: Every day | ORAL | Status: AC
  Administered 2024-01-14 – 2024-01-15 (×2): 400 mg via ORAL
  Filled 2024-01-14 (×2): qty 4

## 2024-01-14 NOTE — Plan of Care (Signed)
  Problem: Clinical Measurements: Goal: Diagnostic test results will improve Outcome: Progressing Goal: Respiratory complications will improve Outcome: Progressing   Problem: Skin Integrity: Goal: Risk for impaired skin integrity will decrease Outcome: Progressing   Problem: Nutrition: Goal: Adequate nutrition will be maintained Outcome: Not Progressing

## 2024-01-14 NOTE — ED Notes (Signed)
 ED TO INPATIENT HANDOFF REPORT  Name/Age/Gender Rock DELENA Sor 53 y.o. female  Code Status    Code Status Orders  (From admission, onward)           Start     Ordered   01/13/24 0231  Full code  Continuous       Question:  By:  Answer:  Consent: discussion documented in EHR   01/13/24 0230           Code Status History     This patient has a current code status but no historical code status.       Home/SNF/Other Home  Chief Complaint Pyelonephritis [N12]  Level of Care/Admitting Diagnosis ED Disposition     ED Disposition  Admit   Condition  --   Comment  Hospital Area: [facility] [[id number]]  Level of Care: Telemetry [5]  Admit to tele based on following criteria: Other see comments  Comments: High risk  May admit patient to [facility] or [facility] if equivalent level of care is available:: Yes  Diagnosis: Pyelonephritis [242234]  Admitting Physician: SHONA TERRY SAILOR [[id number]]  Attending Physician: SHONA TERRY SAILOR [[id number]]  Certification:: I certify this patient will need inpatient services for at least 2 midnights  Expected Medical Readiness: 01/15/2024          Medical History Past Medical History:  Diagnosis Date   Anemia    Asthma    allergy    Family history of adverse reaction to anesthesia    GERD (gastroesophageal reflux disease)    Hypothyroidism    PCOS (polycystic ovarian syndrome)    Renal cell cancer (HCC)     Allergies Allergies[1]  IV Location/Drains/Wounds Patient Lines/Drains/Airways Status     Active Line/Drains/Airways     Name Placement date Placement time Site Days   Peripheral IV 01/13/24 20 G Right Antecubital 01/13/24  0141  Antecubital  1   Peripheral IV 01/13/24 20 G Left Antecubital 01/13/24  1349  Antecubital  1   Ureteral Drain/Stent Right ureter 6 Fr. 10/09/19  1300  Right ureter  1558            Labs/Imaging Results for orders placed or performed during the hospital  encounter of 01/12/24 (from the past 48 hours)  I-Stat Lactic Acid, ED     Status: None   Collection Time: 01/13/24 12:16 AM  Result Value Ref Range   Lactic Acid, Venous 1.5 0.5 - 1.9 mmol/L  Comprehensive metabolic panel     Status: Abnormal   Collection Time: 01/13/24 12:20 AM  Result Value Ref Range   Sodium 133 (L) 135 - 145 mmol/L   Potassium 4.3 3.5 - 5.1 mmol/L   Chloride 96 (L) 98 - 111 mmol/L   CO2 20 (L) 22 - 32 mmol/L   Glucose, Bld 161 (H) 70 - 99 mg/dL    Comment: Glucose reference range applies only to samples taken after fasting for at least 8 hours.   BUN 20 6 - 20 mg/dL   Creatinine, Ser 9.12 0.44 - 1.00 mg/dL   Calcium 89.1 (H) 8.9 - 10.3 mg/dL   Total Protein 7.8 6.5 - 8.1 g/dL   Albumin 4.2 3.5 - 5.0 g/dL   AST 16 15 - 41 U/L   ALT 12 0 - 44 U/L   Alkaline Phosphatase 102 38 - 126 U/L   Total Bilirubin 0.8 0.0 - 1.2 mg/dL   GFR, Estimated >39 >39 mL/min    Comment: (NOTE)  Calculated using the CKD-EPI Creatinine Equation (2021)    Anion gap 17 (H) 5 - 15    Comment: Performed at [facility] [address] [place] [address] [place], [place] [address]  Lipase, blood     Status: None   Collection Time: 01/13/24 12:20 AM  Result Value Ref Range   Lipase 18 11 - 51 U/L    Comment: Performed at [facility], [address], [place], [place] [address]  CBC with Diff     Status: Abnormal   Collection Time: 01/13/24 12:20 AM  Result Value Ref Range   WBC 24.6 (H) 4.0 - 10.5 K/uL   RBC 4.01 3.87 - 5.11 MIL/uL   Hemoglobin 13.4 12.0 - 15.0 g/dL   HCT 60.2 63.9 - 53.9 %   MCV 99.0 80.0 - 100.0 fL   MCH 33.4 26.0 - 34.0 pg   MCHC 33.8 30.0 - 36.0 g/dL   RDW 88.0 88.4 - 84.4 %   Platelets 299 150 - 400 K/uL   nRBC 0.0 0.0 - 0.2 %   Neutrophils Relative % 90 %   Neutro Abs 21.9 (H) 1.7 - 7.7 K/uL   Lymphocytes Relative 3 %   Lymphs Abs 0.8 0.7 - 4.0 K/uL   Monocytes Relative 6 %   Monocytes Absolute 1.6 (H) 0.1 - 1.0 K/uL   Eosinophils  Relative 0 %   Eosinophils Absolute 0.0 0.0 - 0.5 K/uL   Basophils Relative 0 %   Basophils Absolute 0.1 0.0 - 0.1 K/uL   Immature Granulocytes 1 %   Abs Immature Granulocytes 0.25 (H) 0.00 - 0.07 K/uL    Comment: Performed at [facility], [address], [place], [place] [address]  Resp panel by RT-PCR (RSV, Flu A&B, Covid) Anterior Nasal Swab     Status: None   Collection Time: 01/13/24 12:32 AM   Specimen: Anterior Nasal Swab  Result Value Ref Range   SARS Coronavirus 2 by RT PCR NEGATIVE NEGATIVE    Comment: (NOTE) SARS-CoV-2 target nucleic acids are NOT DETECTED.  The SARS-CoV-2 RNA is generally detectable in upper respiratory specimens during the acute phase of infection. The lowest concentration of SARS-CoV-2 viral copies this assay can detect is 138 copies/mL. A negative result does not preclude SARS-Cov-2 infection and should not be used as the sole basis for treatment or other patient management decisions. A negative result may occur with  improper specimen collection/handling, submission of specimen other than nasopharyngeal swab, presence of viral mutation(s) within the areas targeted by this assay, and inadequate number of viral copies(<138 copies/mL). A negative result must be combined with clinical observations, patient history, and epidemiological information. The expected result is Negative.  Fact Sheet for Patients:  bloggercourse.com  Fact Sheet for Healthcare Providers:  seriousbroker.it  This test is no t yet approved or cleared by the [place]  FDA and  has been authorized for detection and/or diagnosis of SARS-CoV-2 by FDA under an Emergency Use Authorization (EUA). This EUA will remain  in effect (meaning this test can be used) for the duration of the COVID-19 declaration under Section 564(b)(1) of the Act, 21 U.S.C.section 360bbb-3(b)(1), unless the authorization is terminated  or  revoked sooner.       Influenza A by PCR NEGATIVE NEGATIVE   Influenza B by PCR NEGATIVE NEGATIVE    Comment: (NOTE) The Xpert Xpress SARS-CoV-2/FLU/RSV plus assay is intended as an aid in the diagnosis of influenza from Nasopharyngeal swab specimens and should not be  used as a sole basis for treatment. Nasal washings and aspirates are unacceptable for Xpert Xpress SARS-CoV-2/FLU/RSV testing.  Fact Sheet for Patients: bloggercourse.com  Fact Sheet for Healthcare Providers: seriousbroker.it  This test is not yet approved or cleared by the [place]  FDA and has been authorized for detection and/or diagnosis of SARS-CoV-2 by FDA under an Emergency Use Authorization (EUA). This EUA will remain in effect (meaning this test can be used) for the duration of the COVID-19 declaration under Section 564(b)(1) of the Act, 21 U.S.C. section 360bbb-3(b)(1), unless the authorization is terminated or revoked.     Resp Syncytial Virus by PCR NEGATIVE NEGATIVE    Comment: (NOTE) Fact Sheet for Patients: bloggercourse.com  Fact Sheet for Healthcare Providers: seriousbroker.it  This test is not yet approved or cleared by the [place]  FDA and has been authorized for detection and/or diagnosis of SARS-CoV-2 by FDA under an Emergency Use Authorization (EUA). This EUA will remain in effect (meaning this test can be used) for the duration of the COVID-19 declaration under Section 564(b)(1) of the Act, 21 U.S.C. section 360bbb-3(b)(1), unless the authorization is terminated or revoked.  Performed at [facility], [address] [place], [place] [address]   Blood Culture (routine x 2)     Status: None (Preliminary result)   Collection Time: 01/13/24  1:30 AM   Specimen: BLOOD LEFT ARM  Result Value Ref Range   Specimen Description      BLOOD LEFT ARM Performed at  [facility], [address] [place], [place] [address]    Special Requests      BOTTLES DRAWN AEROBIC AND ANAEROBIC Blood Culture results may not be optimal due to an inadequate volume of blood received in culture bottles Performed at [facility], [address] [place], [place] [address]    Culture  Setup Time      GRAM NEGATIVE RODS IN BOTH AEROBIC AND ANAEROBIC BOTTLES CRITICAL RESULT CALLED TO, READ BACK BY AND VERIFIED WITH: PHARMD C. SHADE [id number] @1623  FH Performed at [facility], [address] [place], [place] [address]    Culture GRAM NEGATIVE RODS    Report Status PENDING   Blood Culture (routine x 2)     Status: None (Preliminary result)   Collection Time: 01/13/24  1:45 AM   Specimen: BLOOD LEFT ARM  Result Value Ref Range   Specimen Description      BLOOD LEFT ARM Performed at [facility], [address], [place], [place] [address]    Special Requests      BOTTLES DRAWN AEROBIC AND ANAEROBIC Blood Culture results may not be optimal due to an inadequate volume of blood received in culture bottles Performed at [facility], [address] [place], [place] [address]    Culture  Setup Time      GRAM NEGATIVE RODS IN BOTH AEROBIC AND ANAEROBIC BOTTLES CRITICAL RESULT CALLED TO, READ BACK BY AND VERIFIED WITH: PHARMD C. SHADE [id number] @1623  FH Performed at [facility], [address], [place], [place] [address]    Culture GRAM NEGATIVE RODS    Report Status PENDING   Blood Culture ID Panel (Reflexed)     Status: Abnormal   Collection Time: 01/13/24  1:45 AM  Result Value Ref Range   Enterococcus faecalis NOT DETECTED NOT DETECTED   Enterococcus Faecium NOT DETECTED NOT DETECTED   Listeria monocytogenes NOT DETECTED NOT DETECTED   Staphylococcus species  NOT DETECTED NOT DETECTED   Staphylococcus aureus (BCID) NOT DETECTED NOT DETECTED   Staphylococcus epidermidis NOT DETECTED NOT DETECTED    Staphylococcus lugdunensis NOT DETECTED NOT DETECTED   Streptococcus species NOT DETECTED NOT DETECTED   Streptococcus agalactiae NOT DETECTED NOT DETECTED   Streptococcus pneumoniae NOT DETECTED NOT DETECTED   Streptococcus pyogenes NOT DETECTED NOT DETECTED   A.calcoaceticus-baumannii NOT DETECTED NOT DETECTED   Bacteroides fragilis NOT DETECTED NOT DETECTED   Enterobacterales DETECTED (A) NOT DETECTED    Comment: Enterobacterales represent a large order of gram negative bacteria, not a single organism. CRITICAL RESULT CALLED TO, READ BACK BY AND VERIFIED WITH: PHARMD C. SHADE [phone] @1623  FH    Enterobacter cloacae complex NOT DETECTED NOT DETECTED   Escherichia coli DETECTED (A) NOT DETECTED    Comment: CRITICAL RESULT CALLED TO, READ BACK BY AND VERIFIED WITH: PHARMD C. SHADE [phone] @1623  FH    Klebsiella aerogenes NOT DETECTED NOT DETECTED   Klebsiella oxytoca NOT DETECTED NOT DETECTED   Klebsiella pneumoniae NOT DETECTED NOT DETECTED   Proteus species NOT DETECTED NOT DETECTED   Salmonella species NOT DETECTED NOT DETECTED   Serratia marcescens NOT DETECTED NOT DETECTED   Haemophilus influenzae NOT DETECTED NOT DETECTED   Neisseria meningitidis NOT DETECTED NOT DETECTED   Pseudomonas aeruginosa NOT DETECTED NOT DETECTED   Stenotrophomonas maltophilia NOT DETECTED NOT DETECTED   Candida albicans NOT DETECTED NOT DETECTED   Candida auris NOT DETECTED NOT DETECTED   Candida glabrata NOT DETECTED NOT DETECTED   Candida krusei NOT DETECTED NOT DETECTED   Candida parapsilosis NOT DETECTED NOT DETECTED   Candida tropicalis NOT DETECTED NOT DETECTED   Cryptococcus neoformans/gattii NOT DETECTED NOT DETECTED   CTX-M ESBL NOT DETECTED NOT DETECTED   Carbapenem resistance IMP NOT DETECTED NOT DETECTED   Carbapenem resistance KPC NOT DETECTED NOT DETECTED   Carbapenem resistance NDM NOT DETECTED NOT DETECTED   Carbapenem resist OXA 48 LIKE NOT DETECTED NOT DETECTED   Carbapenem  resistance VIM NOT DETECTED NOT DETECTED    Comment: Performed at [facility], [address], [place], [place] [address]  Protime-INR     Status: None   Collection Time: 01/13/24  1:46 AM  Result Value Ref Range   Prothrombin Time 14.7 11.4 - 15.2 seconds   INR 1.1 0.8 - 1.2    Comment: (NOTE) INR goal varies based on device and disease states. Performed at [facility], [address], [place], [place] [address]   Urinalysis, w/ Reflex to Culture (Infection Suspected) -Urine, Clean Catch     Status: Abnormal   Collection Time: 01/13/24  5:11 AM  Result Value Ref Range   Specimen Source URINE, CLEAN CATCH    Color, Urine YELLOW YELLOW   APPearance CLEAR CLEAR   Specific Gravity, Urine 1.036 (H) 1.005 - 1.030   pH 6.0 5.0 - 8.0   Glucose, UA 150 (A) NEGATIVE mg/dL   Hgb urine dipstick NEGATIVE NEGATIVE   Bilirubin Urine NEGATIVE NEGATIVE   Ketones, ur 20 (A) NEGATIVE mg/dL   Protein, ur 899 (A) NEGATIVE mg/dL   Nitrite NEGATIVE NEGATIVE   Leukocytes,Ua NEGATIVE NEGATIVE   RBC / HPF 11-20 0 - 5 RBC/hpf   WBC, UA 21-50 0 - 5 WBC/hpf    Comment:        Reflex urine culture not performed if WBC <=10, OR if Squamous epithelial cells >5. If Squamous epithelial cells >5 suggest recollection.    Bacteria, UA NONE  SEEN NONE SEEN   Squamous Epithelial / HPF 6-10 0 - 5 /HPF    Comment: Performed at [facility], [address], [place], [place] [address]  CBC     Status: Abnormal   Collection Time: 01/13/24  5:11 AM  Result Value Ref Range   WBC 13.4 (H) 4.0 - 10.5 K/uL   RBC 3.33 (L) 3.87 - 5.11 MIL/uL   Hemoglobin 11.2 (L) 12.0 - 15.0 g/dL   HCT 66.2 (L) 63.9 - 53.9 %   MCV 101.2 (H) 80.0 - 100.0 fL   MCH 33.6 26.0 - 34.0 pg   MCHC 33.2 30.0 - 36.0 g/dL   RDW 87.9 88.4 - 84.4 %   Platelets 221 150 - 400 K/uL   nRBC 0.0 0.0 - 0.2 %    Comment: Performed at [facility], [address] [place], [place] [address]   Basic metabolic panel     Status: Abnormal   Collection Time: 01/13/24  5:11 AM  Result Value Ref Range   Sodium 132 (L) 135 - 145 mmol/L   Potassium 3.8 3.5 - 5.1 mmol/L   Chloride 99 98 - 111 mmol/L   CO2 20 (L) 22 - 32 mmol/L   Glucose, Bld 199 (H) 70 - 99 mg/dL    Comment: Glucose reference range applies only to samples taken after fasting for at least 8 hours.   BUN 17 6 - 20 mg/dL   Creatinine, Ser 9.13 0.44 - 1.00 mg/dL   Calcium 9.1 8.9 - 89.6 mg/dL   GFR, Estimated >39 >39 mL/min    Comment: (NOTE) Calculated using the CKD-EPI Creatinine Equation (2021)    Anion gap 12 5 - 15    Comment: Performed at [facility], [address] [place], [place] [address]  Magnesium      Status: Abnormal   Collection Time: 01/13/24  5:11 AM  Result Value Ref Range   Magnesium  1.2 (L) 1.7 - 2.4 mg/dL    Comment: Performed at [facility], [address] [place], [place] [address]  Phosphorus     Status: Abnormal   Collection Time: 01/13/24  5:11 AM  Result Value Ref Range   Phosphorus 2.2 (L) 2.5 - 4.6 mg/dL    Comment: Performed at [facility], [address] [place], [place] [address]  HIV Antibody (routine testing w rflx)     Status: None   Collection Time: 01/13/24  5:11 AM  Result Value Ref Range   HIV Screen 4th Generation wRfx Non Reactive Non Reactive    Comment: Performed at [facility], [address], [place], [place] [address]  Hemoglobin A1c     Status: None   Collection Time: 01/13/24  5:11 AM  Result Value Ref Range   Hgb A1c MFr Bld 4.8 4.8 - 5.6 %    Comment: (NOTE) Diagnosis of Diabetes The following HbA1c ranges recommended by the American Diabetes Association (ADA) may be used as an aid in the diagnosis of diabetes mellitus.  Hemoglobin             Suggested A1C NGSP%              Diagnosis  <5.7                   Non Diabetic  5.7-6.4                Pre-Diabetic  >6.4  Diabetic  <7.0                   Glycemic control for                       adults with diabetes.     Mean Plasma Glucose 91.06 mg/dL    Comment: Performed at [facility], [address] [place], [place] [address]   CT ABDOMEN PELVIS W CONTRAST Result Date: 01/13/2024 EXAM: CT ABDOMEN AND PELVIS WITH CONTRAST 01/13/2024 01:34:25 AM TECHNIQUE: CT of the abdomen and pelvis was performed with the administration of 100 mL of iohexol  (OMNIPAQUE ) 300 MG/ML solution. Multiplanar reformatted images are provided for review. Automated exposure control, iterative reconstruction, and/or weight-based adjustment of the mA/kV was utilized to reduce the radiation dose to as low as reasonably achievable. COMPARISON: 06/12/2019 CLINICAL HISTORY: Back pain and fevers for 1 week. FINDINGS: LOWER CHEST: Lung bases are clear. LIVER: The liver is unremarkable. GALLBLADDER AND BILE DUCTS: Gallbladder has been surgically removed. No biliary ductal dilatation. SPLEEN: No acute abnormality. PANCREAS: No acute abnormality. ADRENAL GLANDS: No acute abnormality. KIDNEYS, URETERS AND BLADDER: The left kidney demonstrates a normal enhancement pattern. The right kidney demonstrates patchy decreased enhancement throughout. Diffuse scattered calcifications are noted, consistent with nonobstructing calculi. No obstructive changes are seen. The bladder is within normal limits. GI AND BOWEL: Stomach demonstrates no acute abnormality. Small bowel is unremarkable. Colon is within normal limits. The appendix is not well visualized, consistent with the prior surgical history. There is no bowel obstruction. PERITONEUM AND RETROPERITONEUM: No ascites. No free air. VASCULATURE: Aorta is normal in caliber. LYMPH NODES: No lymphadenopathy. REPRODUCTIVE ORGANS: The uterus has been surgically removed. BONES AND SOFT TISSUES: No acute osseous abnormality. No focal soft tissue abnormality. IMPRESSION: 1. Patchy decreased enhancement throughout the  right kidney, suspicious for pyelonephritis. Correlate with urinalysis and consider clinical treatment and follow-up as indicated. 2. Nonobstructing right nephrolithiasis. Electronically signed by: Oneil Devonshire MD 01/13/2024 01:52 AM EST RP Workstation: [id number]   DG Chest Port 1 View Result Date: 01/13/2024 EXAM: 1 VIEW XRAY OF THE CHEST 01/13/2024 12:17:00 AM COMPARISON: None available. CLINICAL HISTORY: Questionable sepsis - evaluate for abnormality FINDINGS: LUNGS AND PLEURA: No focal pulmonary opacity. No pleural effusion. No pneumothorax. HEART AND MEDIASTINUM: No acute abnormality of the cardiac and mediastinal silhouettes. BONES AND SOFT TISSUES: Mild thoracic scoliosis. IMPRESSION: 1. No acute cardiopulmonary abnormality. 2. Mild thoracic scoliosis. Electronically signed by: Oneil Devonshire MD 01/13/2024 12:20 AM EST RP Workstation: [id number]    Pending Labs Unresulted Labs (From admission, onward)     Start     Ordered   01/20/24 0500  Creatinine, serum  (enoxaparin  (LOVENOX )    CrCl >/= 30 ml/min)  Weekly,   R     Comments: while on enoxaparin  therapy    01/13/24 0230   01/14/24 0500  CBC with Differential/Platelet  Tomorrow morning,   R        01/13/24 1306   01/14/24 0500  Basic metabolic panel with GFR  Tomorrow morning,   R        01/13/24 1306   01/14/24 0500  Magnesium   Tomorrow morning,   R        01/13/24 1306   01/14/24 0500  Phosphorus  Tomorrow morning,   R        01/13/24 1306            Vitals/Pain Today's Vitals   01/13/24 2256  01/13/24 2300 01/14/24 0020 01/14/24 0023  BP:  128/76    Pulse:  (!) 110    Resp:  (!) 25    Temp: 98.7 F (37.1 C)   98.4 F (36.9 C)  TempSrc:      SpO2:  95%    Weight:      Height:      PainSc:   9      Isolation Precautions No active isolations  Medications Medications  lactated ringers  infusion ( Intravenous New Bag/Given 01/13/24 1825)  enoxaparin  (LOVENOX ) injection 40 mg (40 mg Subcutaneous Given 01/13/24  0920)  acetaminophen  (TYLENOL ) tablet 500 mg (has no administration in time range)  melatonin tablet 5 mg (has no administration in time range)  polyethylene glycol (MIRALAX  / GLYCOLAX ) packet 17 g (has no administration in time range)  oxyCODONE  (Oxy IR/ROXICODONE ) immediate release tablet 5-10 mg (has no administration in time range)  HYDROmorphone  (DILAUDID ) injection 0.5 mg (0.5 mg Intravenous Given 01/14/24 0020)  senna-docusate (Senokot-S) tablet 2 tablet (2 tablets Oral Not Given 01/13/24 0918)  thyroid  (ARMOUR) tablet 120 mg (120 mg Oral Given 01/13/24 0621)  promethazine  (PHENERGAN ) 12.5 mg in sodium chloride  0.9 % 50 mL IVPB (12.5 mg Intravenous New Bag/Given 01/13/24 2144)  cefTRIAXone  (ROCEPHIN ) 2 g in sodium chloride  0.9 % 100 mL IVPB (0 g Intravenous Stopped 01/13/24 1428)  ondansetron  (ZOFRAN ) injection 4 mg (4 mg Intravenous Given 01/14/24 0021)  HYDROmorphone  (DILAUDID ) injection 0.5 mg (0.5 mg Intravenous Given 01/13/24 0016)  acetaminophen  (OFIRMEV ) IV 1,000 mg (0 mg Intravenous Stopped 01/13/24 1835)  ondansetron  (ZOFRAN ) injection 4 mg (4 mg Intravenous Given 01/13/24 0016)  lactated ringers  bolus 1,000 mL (0 mLs Intravenous Stopped 01/13/24 0211)    And  lactated ringers  bolus 1,000 mL (0 mLs Intravenous Stopped 01/13/24 0308)    And  lactated ringers  bolus 500 mL (0 mLs Intravenous Stopped 01/13/24 0357)  ceFEPIme  (MAXIPIME ) 2 g in sodium chloride  0.9 % 100 mL IVPB (0 g Intravenous Stopped 01/13/24 0313)  metroNIDAZOLE  (FLAGYL ) IVPB 500 mg (0 mg Intravenous Stopped 01/13/24 0244)  vancomycin  (VANCOCIN ) IVPB 1000 mg/200 mL premix (0 mg Intravenous Stopped 01/13/24 0346)  iohexol  (OMNIPAQUE ) 300 MG/ML solution 100 mL (100 mLs Intravenous Contrast Given 01/13/24 0120)  HYDROmorphone  (DILAUDID ) injection 0.5 mg (0.5 mg Intravenous Given 01/13/24 0238)  HYDROmorphone  (DILAUDID ) injection 1 mg (1 mg Intravenous Given 01/13/24 0411)  alum & mag hydroxide-simeth  (MAALOX/MYLANTA) 200-200-20 MG/5ML suspension 30 mL (30 mLs Oral Given 01/13/24 0411)    And  lidocaine  (XYLOCAINE ) 2 % viscous mouth solution 15 mL (15 mLs Oral Given 01/13/24 0411)  magnesium  sulfate IVPB 4 g 100 mL (0 g Intravenous Stopped 01/13/24 1544)    Mobility walks with person assist     [1]  Allergies Allergen Reactions   Tramadol  Other (See Comments)    Severe Headaches Severe Headaches    Adhesive [Tape] Rash

## 2024-01-14 NOTE — Progress Notes (Signed)
 PROGRESS NOTE    [name]  FMW:[id number] DOB: 20-Mar-1970 DOA: 01/12/2024 PCP: System, Provider Not In    Brief Narrative:  53 year old with multiple recurrent UTIs, chronic cystitis, urolithiasis, hypothyroidism, right renal cell cancer status post partial nephrectomy, gastric fundoplication, history of hysterectomy presented to the ER with right lower back pain, temperature 103 at home with chills. In the emergency room temperature 99.6, tachycardic with heart rate 140, tachypneic with respiratory rate 22. WC count 24.6. UA was abnormal. CT scan consistent with right parenchymal enhancement suspicious for pyelonephritis. Blood cultures and urine cultures were drawn, patient was given vancomycin  cefepime  and Flagyl .   Subjective: Patient seen and examined.  Low-grade fever overnight.  Continues to have nausea.  Patient tells me that it takes her forever to get over the nausea because of fundoplication. Today denies any dysuria. She does have bilateral flank pain and back pain which is most likely related to dry heaving. Blood cultures positive for E. coli.  Apparently urine cultures were not done.  Unable to eat anything meaningfully due to ongoing dry heaving.   Assessment & Plan:   Sepsis present on admission, right-sided pyelonephritis: History of recurrent UTI. Blood cultures, E. coli.  No resistance found. Urine cultures, apparently not collected.  Will send urine culture today. CT scan with no evidence of hydronephrosis, nonobstructive right nephrolithiasis present. Patient was treated with vancomycin , meropenem .  Given clinical stability, now on Rocephin  2 g daily.  Continue maintenance IV fluids.  Encourage oral intake.  Mobilize. Does have nonobstructive nephrolithiasis, family will schedule outpatient follow-up with her urologist.  Intractable nausea, history of gastric fundoplication with abdominal pain: Persistently symptomatic. Continue maintenance fluid. Zofran  4 mg  IV Q6 hourly scheduled Phenergan  as needed for breakthrough nausea  Hypothyroidism: On Synthroid.  Resume.  Chronic pain syndrome: On gabapentin , resume.  Hypophosphatemia: Replace.    DVT prophylaxis: enoxaparin  (LOVENOX ) injection 40 mg Start: 01/13/24 1000   Code Status: Full code Family Communication: Husband called and updated. Disposition Plan: Status is: Inpatient Remains inpatient appropriate because: IV antibiotics, persistently symptomatic     Consultants:  None  Procedures:  None  Antimicrobials:  Rocephin  12/15---     Objective: Vitals:   01/14/24 0037 01/14/24 0442 01/14/24 0825 01/14/24 1314  BP: 125/75 127/71 131/72 (!) 129/91  Pulse: (!) 119 (!) 113 (!) 116 (!) 114  Resp: 18 20 20 20   Temp: 98.4 F (36.9 C) 98.6 F (37 C) 99.1 F (37.3 C) 99.5 F (37.5 C)  TempSrc:   Oral Oral  SpO2: 94% 91% 94% 92%  Weight:      Height:        Intake/Output Summary (Last 24 hours) at 01/14/2024 1338 Last data filed at 01/14/2024 0300 Gross per 24 hour  Intake 431.29 ml  Output --  Net 431.29 ml   Filed Weights   01/13/24 0300  Weight: 74.5 kg    Examination:  General exam: Anxious.  Sick looking. Respiratory system: Clear to auscultation. Respiratory effort normal.  No added sounds. Cardiovascular system: S1 & S2 heard, RRR.  No pedal edema. Gastrointestinal system: Soft.  Mild tenderness along the flanks without rigidity or guarding.  Bowel sounds present.  No point tenderness. Central nervous system: Alert and oriented. No focal neurological deficits. Extremities: Symmetric 5 x 5 power.    Data Reviewed: I have personally reviewed following labs and imaging studies  CBC: Recent Labs  Lab 01/13/24 0020 01/13/24 0511 01/14/24 0600  WBC 24.6* 13.4* 17.5*  NEUTROABS  21.9*  --  15.3*  HGB 13.4 11.2* 11.3*  HCT 39.7 33.7* 34.1*  MCV 99.0 101.2* 101.2*  PLT 299 221 215   Basic Metabolic Panel: Recent Labs  Lab 01/13/24 0020  01/13/24 0511 01/14/24 0600  NA 133* 132* 133*  K 4.3 3.8 4.5  CL 96* 99 99  CO2 20* 20* 22  GLUCOSE 161* 199* 105*  BUN 20 17 12   CREATININE 0.87 0.86 0.68  CALCIUM 10.8* 9.1 9.1  MG  --  1.2* 2.0  PHOS  --  2.2* 2.1*   GFR: Estimated Creatinine Clearance: 84 mL/min (by C-G formula based on SCr of 0.68 mg/dL). Liver Function Tests: Recent Labs  Lab 01/13/24 0020  AST 16  ALT 12  ALKPHOS 102  BILITOT 0.8  PROT 7.8  ALBUMIN 4.2   Recent Labs  Lab 01/13/24 0020  LIPASE 18   No results for input(s): AMMONIA in the last 168 hours. Coagulation Profile: Recent Labs  Lab 01/13/24 0146  INR 1.1   Cardiac Enzymes: No results for input(s): CKTOTAL, CKMB, CKMBINDEX, TROPONINI in the last 168 hours. BNP (last 3 results) No results for input(s): PROBNP in the last 8760 hours. HbA1C: Recent Labs    01/13/24 0511  HGBA1C 4.8   CBG: No results for input(s): GLUCAP in the last 168 hours. Lipid Profile: No results for input(s): CHOL, HDL, LDLCALC, TRIG, CHOLHDL, LDLDIRECT in the last 72 hours. Thyroid  Function Tests: No results for input(s): TSH, T4TOTAL, FREET4, T3FREE, THYROIDAB in the last 72 hours. Anemia Panel: No results for input(s): VITAMINB12, FOLATE, FERRITIN, TIBC, IRON, RETICCTPCT in the last 72 hours. Sepsis Labs: Recent Labs  Lab 01/13/24 0016  LATICACIDVEN 1.5    Recent Results (from the past 240 hours)  Resp panel by RT-PCR (RSV, Flu A&B, Covid) Anterior Nasal Swab     Status: None   Collection Time: 01/13/24 12:32 AM   Specimen: Anterior Nasal Swab  Result Value Ref Range Status   SARS Coronavirus 2 by RT PCR NEGATIVE NEGATIVE Final    Comment: (NOTE) SARS-CoV-2 target nucleic acids are NOT DETECTED.  The SARS-CoV-2 RNA is generally detectable in upper respiratory specimens during the acute phase of infection. The lowest concentration of SARS-CoV-2 viral copies this assay can detect is 138  copies/mL. A negative result does not preclude SARS-Cov-2 infection and should not be used as the sole basis for treatment or other patient management decisions. A negative result may occur with  improper specimen collection/handling, submission of specimen other than nasopharyngeal swab, presence of viral mutation(s) within the areas targeted by this assay, and inadequate number of viral copies(<138 copies/mL). A negative result must be combined with clinical observations, patient history, and epidemiological information. The expected result is Negative.  Fact Sheet for Patients:  bloggercourse.com  Fact Sheet for Healthcare Providers:  seriousbroker.it  This test is no t yet approved or cleared by the [place]  FDA and  has been authorized for detection and/or diagnosis of SARS-CoV-2 by FDA under an Emergency Use Authorization (EUA). This EUA will remain  in effect (meaning this test can be used) for the duration of the COVID-19 declaration under Section 564(b)(1) of the Act, 21 U.S.C.section 360bbb-3(b)(1), unless the authorization is terminated  or revoked sooner.       Influenza A by PCR NEGATIVE NEGATIVE Final   Influenza B by PCR NEGATIVE NEGATIVE Final    Comment: (NOTE) The Xpert Xpress SARS-CoV-2/FLU/RSV plus assay is intended as an aid in the diagnosis of influenza  from Nasopharyngeal swab specimens and should not be used as a sole basis for treatment. Nasal washings and aspirates are unacceptable for Xpert Xpress SARS-CoV-2/FLU/RSV testing.  Fact Sheet for Patients: bloggercourse.com  Fact Sheet for Healthcare Providers: seriousbroker.it  This test is not yet approved or cleared by the [place]  FDA and has been authorized for detection and/or diagnosis of SARS-CoV-2 by FDA under an Emergency Use Authorization (EUA). This EUA will remain in effect (meaning  this test can be used) for the duration of the COVID-19 declaration under Section 564(b)(1) of the Act, 21 U.S.C. section 360bbb-3(b)(1), unless the authorization is terminated or revoked.     Resp Syncytial Virus by PCR NEGATIVE NEGATIVE Final    Comment: (NOTE) Fact Sheet for Patients: bloggercourse.com  Fact Sheet for Healthcare Providers: seriousbroker.it  This test is not yet approved or cleared by the [place]  FDA and has been authorized for detection and/or diagnosis of SARS-CoV-2 by FDA under an Emergency Use Authorization (EUA). This EUA will remain in effect (meaning this test can be used) for the duration of the COVID-19 declaration under Section 564(b)(1) of the Act, 21 U.S.C. section 360bbb-3(b)(1), unless the authorization is terminated or revoked.  Performed at [facility], [address] [place], [place] [address]   Blood Culture (routine x 2)     Status: None (Preliminary result)   Collection Time: 01/13/24  1:30 AM   Specimen: BLOOD LEFT ARM  Result Value Ref Range Status   Specimen Description   Final    BLOOD LEFT ARM Performed at [facility], [address] [place], [place] [address]    Special Requests   Final    BOTTLES DRAWN AEROBIC AND ANAEROBIC Blood Culture results may not be optimal due to an inadequate volume of blood received in culture bottles Performed at [facility], [address] [place], [place] [address]    Culture  Setup Time   Final    GRAM NEGATIVE RODS IN BOTH AEROBIC AND ANAEROBIC BOTTLES CRITICAL RESULT CALLED TO, READ BACK BY AND VERIFIED WITH: PHARMD C. SHADE [id number] @1623  FH    Culture   Final    GRAM NEGATIVE RODS IDENTIFICATION TO FOLLOW Performed at [facility], [address] [place], [place] [address]    Report Status PENDING  Incomplete  Blood Culture (routine x 2)     Status: Abnormal (Preliminary result)    Collection Time: 01/13/24  1:45 AM   Specimen: BLOOD LEFT ARM  Result Value Ref Range Status   Specimen Description   Final    BLOOD LEFT ARM Performed at [facility], [address] [place], [place] [address]    Special Requests   Final    BOTTLES DRAWN AEROBIC AND ANAEROBIC Blood Culture results may not be optimal due to an inadequate volume of blood received in culture bottles Performed at [facility], [address], [place], [place] [address]    Culture  Setup Time   Final    GRAM NEGATIVE RODS IN BOTH AEROBIC AND ANAEROBIC BOTTLES CRITICAL RESULT CALLED TO, READ BACK BY AND VERIFIED WITH: PHARMD C. SHADE [id number] @1623  FH    Culture (A)  Final    ESCHERICHIA COLI SUSCEPTIBILITIES TO FOLLOW Performed at [facility], [address], [place], [place] [address]    Report Status PENDING  Incomplete  Blood Culture ID Panel (Reflexed)     Status: Abnormal   Collection Time:  01/13/24  1:45 AM  Result Value Ref Range Status   Enterococcus faecalis NOT DETECTED NOT DETECTED Final   Enterococcus Faecium NOT DETECTED NOT DETECTED Final   Listeria monocytogenes NOT DETECTED NOT DETECTED Final   Staphylococcus species NOT DETECTED NOT DETECTED Final   Staphylococcus aureus (BCID) NOT DETECTED NOT DETECTED Final   Staphylococcus epidermidis NOT DETECTED NOT DETECTED Final   Staphylococcus lugdunensis NOT DETECTED NOT DETECTED Final   Streptococcus species NOT DETECTED NOT DETECTED Final   Streptococcus agalactiae NOT DETECTED NOT DETECTED Final   Streptococcus pneumoniae NOT DETECTED NOT DETECTED Final   Streptococcus pyogenes NOT DETECTED NOT DETECTED Final   A.calcoaceticus-baumannii NOT DETECTED NOT DETECTED Final   Bacteroides fragilis NOT DETECTED NOT DETECTED Final   Enterobacterales DETECTED (A) NOT DETECTED Final    Comment: Enterobacterales represent a large order of gram negative bacteria, not a single organism. CRITICAL RESULT CALLED TO, READ  BACK BY AND VERIFIED WITH: PHARMD C. SHADE [id number] @1623  FH    Enterobacter cloacae complex NOT DETECTED NOT DETECTED Final   Escherichia coli DETECTED (A) NOT DETECTED Final    Comment: CRITICAL RESULT CALLED TO, READ BACK BY AND VERIFIED WITH: PHARMD C. SHADE [id number] @1623  FH    Klebsiella aerogenes NOT DETECTED NOT DETECTED Final   Klebsiella oxytoca NOT DETECTED NOT DETECTED Final   Klebsiella pneumoniae NOT DETECTED NOT DETECTED Final   Proteus species NOT DETECTED NOT DETECTED Final   Salmonella species NOT DETECTED NOT DETECTED Final   Serratia marcescens NOT DETECTED NOT DETECTED Final   Haemophilus influenzae NOT DETECTED NOT DETECTED Final   Neisseria meningitidis NOT DETECTED NOT DETECTED Final   Pseudomonas aeruginosa NOT DETECTED NOT DETECTED Final   Stenotrophomonas maltophilia NOT DETECTED NOT DETECTED Final   Candida albicans NOT DETECTED NOT DETECTED Final   Candida auris NOT DETECTED NOT DETECTED Final   Candida glabrata NOT DETECTED NOT DETECTED Final   Candida krusei NOT DETECTED NOT DETECTED Final   Candida parapsilosis NOT DETECTED NOT DETECTED Final   Candida tropicalis NOT DETECTED NOT DETECTED Final   Cryptococcus neoformans/gattii NOT DETECTED NOT DETECTED Final   CTX-M ESBL NOT DETECTED NOT DETECTED Final   Carbapenem resistance IMP NOT DETECTED NOT DETECTED Final   Carbapenem resistance KPC NOT DETECTED NOT DETECTED Final   Carbapenem resistance NDM NOT DETECTED NOT DETECTED Final   Carbapenem resist OXA 48 LIKE NOT DETECTED NOT DETECTED Final   Carbapenem resistance VIM NOT DETECTED NOT DETECTED Final    Comment: Performed at [facility], [address], [place], [place] [address]         Radiology Studies: CT ABDOMEN PELVIS W CONTRAST Result Date: 01/13/2024 EXAM: CT ABDOMEN AND PELVIS WITH CONTRAST 01/13/2024 01:34:25 AM TECHNIQUE: CT of the abdomen and pelvis was performed with the administration of 100 mL of iohexol  (OMNIPAQUE ) 300 MG/ML  solution. Multiplanar reformatted images are provided for review. Automated exposure control, iterative reconstruction, and/or weight-based adjustment of the mA/kV was utilized to reduce the radiation dose to as low as reasonably achievable. COMPARISON: 06/12/2019 CLINICAL HISTORY: Back pain and fevers for 1 week. FINDINGS: LOWER CHEST: Lung bases are clear. LIVER: The liver is unremarkable. GALLBLADDER AND BILE DUCTS: Gallbladder has been surgically removed. No biliary ductal dilatation. SPLEEN: No acute abnormality. PANCREAS: No acute abnormality. ADRENAL GLANDS: No acute abnormality. KIDNEYS, URETERS AND BLADDER: The left kidney demonstrates a normal enhancement pattern. The right kidney demonstrates patchy decreased enhancement throughout. Diffuse scattered calcifications are noted, consistent with nonobstructing calculi.  No obstructive changes are seen. The bladder is within normal limits. GI AND BOWEL: Stomach demonstrates no acute abnormality. Small bowel is unremarkable. Colon is within normal limits. The appendix is not well visualized, consistent with the prior surgical history. There is no bowel obstruction. PERITONEUM AND RETROPERITONEUM: No ascites. No free air. VASCULATURE: Aorta is normal in caliber. LYMPH NODES: No lymphadenopathy. REPRODUCTIVE ORGANS: The uterus has been surgically removed. BONES AND SOFT TISSUES: No acute osseous abnormality. No focal soft tissue abnormality. IMPRESSION: 1. Patchy decreased enhancement throughout the right kidney, suspicious for pyelonephritis. Correlate with urinalysis and consider clinical treatment and follow-up as indicated. 2. Nonobstructing right nephrolithiasis. Electronically signed by: Oneil Devonshire MD 01/13/2024 01:52 AM EST RP Workstation: [id number]   DG Chest Port 1 View Result Date: 01/13/2024 EXAM: 1 VIEW XRAY OF THE CHEST 01/13/2024 12:17:00 AM COMPARISON: None available. CLINICAL HISTORY: Questionable sepsis - evaluate for abnormality FINDINGS:  LUNGS AND PLEURA: No focal pulmonary opacity. No pleural effusion. No pneumothorax. HEART AND MEDIASTINUM: No acute abnormality of the cardiac and mediastinal silhouettes. BONES AND SOFT TISSUES: Mild thoracic scoliosis. IMPRESSION: 1. No acute cardiopulmonary abnormality. 2. Mild thoracic scoliosis. Electronically signed by: Oneil Devonshire MD 01/13/2024 12:20 AM EST RP Workstation: [id number]        Scheduled Meds:  enoxaparin  (LOVENOX ) injection  40 mg Subcutaneous Q24H   estradiol   1 mg Oral QPM   gabapentin   300 mg Oral TID   ondansetron  (ZOFRAN ) IV  4 mg Intravenous Q6H   progesterone   400 mg Oral QHS   senna-docusate  2 tablet Oral BID   senna-docusate  2 tablet Oral BID   thyroid   120 mg Oral Q0600   traZODone   75 mg Oral QHS   valACYclovir   1,000 mg Oral Q12H   Continuous Infusions:  sodium chloride      cefTRIAXone  (ROCEPHIN )  IV 2 g (01/14/24 1015)   promethazine  (PHENERGAN ) injection (IM or IVPB) 12.5 mg (01/14/24 1056)   sodium PHOSPHATE  IVPB (in mmol)       LOS: 1 day    Time spent: 51 minutes    Renato Applebaum, MD [facility]

## 2024-01-14 NOTE — Plan of Care (Signed)
  Problem: Activity: Goal: Risk for activity intolerance will decrease Outcome: Progressing   Problem: Coping: Goal: Level of anxiety will decrease Outcome: Progressing   Problem: Pain Managment: Goal: General experience of comfort will improve and/or be controlled Outcome: Progressing   Problem: Safety: Goal: Ability to remain free from injury will improve Outcome: Progressing

## 2024-01-15 DIAGNOSIS — N12 Tubulo-interstitial nephritis, not specified as acute or chronic: Secondary | ICD-10-CM | POA: Diagnosis not present

## 2024-01-15 LAB — CBC WITH DIFFERENTIAL/PLATELET
Abs Immature Granulocytes: 0.07 K/uL (ref 0.00–0.07)
Basophils Absolute: 0 K/uL (ref 0.0–0.1)
Basophils Relative: 0 %
Eosinophils Absolute: 0.1 K/uL (ref 0.0–0.5)
Eosinophils Relative: 1 %
HCT: 31.6 % — ABNORMAL LOW (ref 36.0–46.0)
Hemoglobin: 10.5 g/dL — ABNORMAL LOW (ref 12.0–15.0)
Immature Granulocytes: 1 %
Lymphocytes Relative: 9 %
Lymphs Abs: 1.1 K/uL (ref 0.7–4.0)
MCH: 32.9 pg (ref 26.0–34.0)
MCHC: 33.2 g/dL (ref 30.0–36.0)
MCV: 99.1 fL (ref 80.0–100.0)
Monocytes Absolute: 1.2 K/uL — ABNORMAL HIGH (ref 0.1–1.0)
Monocytes Relative: 10 %
Neutro Abs: 9.9 K/uL — ABNORMAL HIGH (ref 1.7–7.7)
Neutrophils Relative %: 79 %
Platelets: 240 K/uL (ref 150–400)
RBC: 3.19 MIL/uL — ABNORMAL LOW (ref 3.87–5.11)
RDW: 12 % (ref 11.5–15.5)
WBC: 12.3 K/uL — ABNORMAL HIGH (ref 4.0–10.5)
nRBC: 0 % (ref 0.0–0.2)

## 2024-01-15 LAB — CULTURE, BLOOD (ROUTINE X 2)

## 2024-01-15 MED ORDER — OXYCODONE HCL 5 MG PO TABS
5.0000 mg | ORAL_TABLET | ORAL | Status: AC | PRN
  Administered 2024-01-15 – 2024-01-16 (×3): 10 mg via ORAL
  Filled 2024-01-15 (×3): qty 2

## 2024-01-15 MED ORDER — BISACODYL 10 MG RE SUPP
10.0000 mg | Freq: Once | RECTAL | Status: AC
  Administered 2024-01-15: 11:00:00 10 mg via RECTAL
  Filled 2024-01-15: qty 1

## 2024-01-15 MED ORDER — ONDANSETRON HCL 4 MG/2ML IJ SOLN
4.0000 mg | Freq: Four times a day (QID) | INTRAMUSCULAR | Status: DC
  Administered 2024-01-15 – 2024-01-16 (×5): 4 mg via INTRAVENOUS
  Filled 2024-01-15 (×5): qty 2

## 2024-01-15 NOTE — Plan of Care (Signed)
   Problem: Activity: Goal: Risk for activity intolerance will decrease Outcome: Progressing   Problem: Coping: Goal: Level of anxiety will decrease Outcome: Progressing   Problem: Safety: Goal: Ability to remain free from injury will improve Outcome: Progressing

## 2024-01-15 NOTE — Plan of Care (Signed)

## 2024-01-15 NOTE — Progress Notes (Signed)
 PROGRESS NOTE    [name]  FMW:[id number] DOB: 08/18/1970 DOA: 01/12/2024 PCP: System, Provider Not In    Brief Narrative:  53 year old with multiple recurrent UTIs, chronic cystitis, urolithiasis, hypothyroidism, right renal cell cancer status post partial nephrectomy, gastric fundoplication, history of hysterectomy presented to the ER with right lower back pain, temperature 103 at home with chills. In the emergency room temperature 99.6, tachycardic with heart rate 140, tachypneic with respiratory rate 22. WC count 24.6. UA was abnormal. CT scan consistent with right parenchymal enhancement suspicious for pyelonephritis. Blood cultures and urine cultures were drawn, patient was given vancomycin  cefepime  and Flagyl .   Subjective: Patient seen and examined.  Remains afebrile last 24 hours.  Denies any dysuria.  She still has significant dry heaving, ongoing nausea and intolerance to diet.  As needed nausea medications not helping.    Assessment & Plan:   Sepsis present on admission, right-sided pyelonephritis: History of recurrent UTI. Blood cultures, E. coli.  Pansensitive.  Still symptomatic.  Continue Rocephin .  Can do cefadroxil  on discharge. Urine cultures, apparently not done before restarting antibiotics.  No growth so far. CT scan with no evidence of hydronephrosis, nonobstructive right nephrolithiasis present. Continue Rocephin  today.  Anticipate oral antibiotics with cefadroxil  on discharge for total 10 days of therapy. Continue maintenance IV fluids.  Encourage oral intake.  Mobilize. Does have nonobstructive nephrolithiasis, family will schedule outpatient follow-up with her urologist.  Intractable nausea, history of gastric fundoplication with abdominal pain: Persistently symptomatic. Continue maintenance fluid. Zofran  4 mg IV Q6 hourly scheduled Phenergan  as needed for breakthrough nausea Can use Benadryl  and Ativan  for symptom control.  Hypothyroidism: On  Synthroid.    Chronic pain syndrome: On gabapentin ,  Hypophosphatemia: Replete.  Hypomagnesemia: Replaced and adequate.    DVT prophylaxis: enoxaparin  (LOVENOX ) injection 40 mg Start: 01/13/24 1000   Code Status: Full code Family Communication: None today at the bedside. Disposition Plan: Status is: Inpatient Remains inpatient appropriate because: IV antibiotics, persistently symptomatic     Consultants:  None  Procedures:  None  Antimicrobials:  Rocephin  12/15---     Objective: Vitals:   01/14/24 1951 01/15/24 0100 01/15/24 0549 01/15/24 0949  BP: (!) 147/75 138/75 126/68 (!) 154/91  Pulse: (!) 121 (!) 117 (!) 112 (!) 113  Resp: 16 16 18 18   Temp: 100.1 F (37.8 C) 99.3 F (37.4 C) 99.6 F (37.6 C) 99.2 F (37.3 C)  TempSrc: Oral Oral Oral Oral  SpO2: 93% 93% 90% 92%  Weight:      Height:        Intake/Output Summary (Last 24 hours) at 01/15/2024 1143 Last data filed at 01/15/2024 9350 Gross per 24 hour  Intake 916.88 ml  Output --  Net 916.88 ml   Filed Weights   01/13/24 0300  Weight: 74.5 kg    Examination:  General exam: Anxious.  Mildly uncomfortable on exam. Respiratory system: Clear to auscultation. Respiratory effort normal.  No added sounds. Cardiovascular system: S1 & S2 heard, RRR.  No pedal edema. Gastrointestinal system: Soft.  No rigidity or guarding.  No point tenderness.  Diffuse muscle ache bilateral flank and back.  Bowel sounds present.  No point tenderness. Central nervous system: Alert and oriented. No focal neurological deficits. Extremities: Symmetric 5 x 5 power.    Data Reviewed: I have personally reviewed following labs and imaging studies  CBC: Recent Labs  Lab 01/13/24 0020 01/13/24 0511 01/14/24 0600 01/15/24 0710  WBC 24.6* 13.4* 17.5* 12.3*  NEUTROABS 21.9*  --  15.3* 9.9*  HGB 13.4 11.2* 11.3* 10.5*  HCT 39.7 33.7* 34.1* 31.6*  MCV 99.0 101.2* 101.2* 99.1  PLT 299 221 215 240   Basic Metabolic  Panel: Recent Labs  Lab 01/13/24 0020 01/13/24 0511 01/14/24 0600  NA 133* 132* 133*  K 4.3 3.8 4.5  CL 96* 99 99  CO2 20* 20* 22  GLUCOSE 161* 199* 105*  BUN 20 17 12   CREATININE 0.87 0.86 0.68  CALCIUM 10.8* 9.1 9.1  MG  --  1.2* 2.0  PHOS  --  2.2* 2.1*   GFR: Estimated Creatinine Clearance: 84 mL/min (by C-G formula based on SCr of 0.68 mg/dL). Liver Function Tests: Recent Labs  Lab 01/13/24 0020  AST 16  ALT 12  ALKPHOS 102  BILITOT 0.8  PROT 7.8  ALBUMIN 4.2   Recent Labs  Lab 01/13/24 0020  LIPASE 18   No results for input(s): AMMONIA in the last 168 hours. Coagulation Profile: Recent Labs  Lab 01/13/24 0146  INR 1.1   Cardiac Enzymes: No results for input(s): CKTOTAL, CKMB, CKMBINDEX, TROPONINI in the last 168 hours. BNP (last 3 results) No results for input(s): PROBNP in the last 8760 hours. HbA1C: Recent Labs    01/13/24 0511  HGBA1C 4.8   CBG: No results for input(s): GLUCAP in the last 168 hours. Lipid Profile: No results for input(s): CHOL, HDL, LDLCALC, TRIG, CHOLHDL, LDLDIRECT in the last 72 hours. Thyroid  Function Tests: No results for input(s): TSH, T4TOTAL, FREET4, T3FREE, THYROIDAB in the last 72 hours. Anemia Panel: No results for input(s): VITAMINB12, FOLATE, FERRITIN, TIBC, IRON, RETICCTPCT in the last 72 hours. Sepsis Labs: Recent Labs  Lab 01/13/24 0016  LATICACIDVEN 1.5    Recent Results (from the past 240 hours)  Resp panel by RT-PCR (RSV, Flu A&B, Covid) Anterior Nasal Swab     Status: None   Collection Time: 01/13/24 12:32 AM   Specimen: Anterior Nasal Swab  Result Value Ref Range Status   SARS Coronavirus 2 by RT PCR NEGATIVE NEGATIVE Final    Comment: (NOTE) SARS-CoV-2 target nucleic acids are NOT DETECTED.  The SARS-CoV-2 RNA is generally detectable in upper respiratory specimens during the acute phase of infection. The lowest concentration of SARS-CoV-2 viral  copies this assay can detect is 138 copies/mL. A negative result does not preclude SARS-Cov-2 infection and should not be used as the sole basis for treatment or other patient management decisions. A negative result may occur with  improper specimen collection/handling, submission of specimen other than nasopharyngeal swab, presence of viral mutation(s) within the areas targeted by this assay, and inadequate number of viral copies(<138 copies/mL). A negative result must be combined with clinical observations, patient history, and epidemiological information. The expected result is Negative.  Fact Sheet for Patients:  bloggercourse.com  Fact Sheet for Healthcare Providers:  seriousbroker.it  This test is no t yet approved or cleared by the [place]  FDA and  has been authorized for detection and/or diagnosis of SARS-CoV-2 by FDA under an Emergency Use Authorization (EUA). This EUA will remain  in effect (meaning this test can be used) for the duration of the COVID-19 declaration under Section 564(b)(1) of the Act, 21 U.S.C.section 360bbb-3(b)(1), unless the authorization is terminated  or revoked sooner.       Influenza A by PCR NEGATIVE NEGATIVE Final   Influenza B by PCR NEGATIVE NEGATIVE Final    Comment: (NOTE) The Xpert Xpress SARS-CoV-2/FLU/RSV plus assay is intended as an aid in the diagnosis of  influenza from Nasopharyngeal swab specimens and should not be used as a sole basis for treatment. Nasal washings and aspirates are unacceptable for Xpert Xpress SARS-CoV-2/FLU/RSV testing.  Fact Sheet for Patients: bloggercourse.com  Fact Sheet for Healthcare Providers: seriousbroker.it  This test is not yet approved or cleared by the [place]  FDA and has been authorized for detection and/or diagnosis of SARS-CoV-2 by FDA under an Emergency Use Authorization (EUA). This  EUA will remain in effect (meaning this test can be used) for the duration of the COVID-19 declaration under Section 564(b)(1) of the Act, 21 U.S.C. section 360bbb-3(b)(1), unless the authorization is terminated or revoked.     Resp Syncytial Virus by PCR NEGATIVE NEGATIVE Final    Comment: (NOTE) Fact Sheet for Patients: bloggercourse.com  Fact Sheet for Healthcare Providers: seriousbroker.it  This test is not yet approved or cleared by the [place]  FDA and has been authorized for detection and/or diagnosis of SARS-CoV-2 by FDA under an Emergency Use Authorization (EUA). This EUA will remain in effect (meaning this test can be used) for the duration of the COVID-19 declaration under Section 564(b)(1) of the Act, 21 U.S.C. section 360bbb-3(b)(1), unless the authorization is terminated or revoked.  Performed at [facility], [address], [place], [place] [address]   Blood Culture (routine x 2)     Status: Abnormal   Collection Time: 01/13/24  1:30 AM   Specimen: BLOOD LEFT ARM  Result Value Ref Range Status   Specimen Description   Final    BLOOD LEFT ARM Performed at [facility], [address] [place], [place] [address]    Special Requests   Final    BOTTLES DRAWN AEROBIC AND ANAEROBIC Blood Culture results may not be optimal due to an inadequate volume of blood received in culture bottles Performed at [facility], [address] [place], [place] [address]    Culture  Setup Time   Final    GRAM NEGATIVE RODS IN BOTH AEROBIC AND ANAEROBIC BOTTLES CRITICAL RESULT CALLED TO, READ BACK BY AND VERIFIED WITH: PHARMD C. SHADE [id number] @1623  FH    Culture (A)  Final    ESCHERICHIA COLI SUSCEPTIBILITIES PERFORMED ON PREVIOUS CULTURE WITHIN THE LAST 5 DAYS. Performed at [facility], [address] [place], [place] [address]    Report Status 01/15/2024 FINAL  Final   Blood Culture (routine x 2)     Status: Abnormal   Collection Time: 01/13/24  1:45 AM   Specimen: BLOOD LEFT ARM  Result Value Ref Range Status   Specimen Description   Final    BLOOD LEFT ARM Performed at [facility], [address], [place], [place] [address]    Special Requests   Final    BOTTLES DRAWN AEROBIC AND ANAEROBIC Blood Culture results may not be optimal due to an inadequate volume of blood received in culture bottles Performed at [facility], [address], [place], [place] [address]    Culture  Setup Time   Final    GRAM NEGATIVE RODS IN BOTH AEROBIC AND ANAEROBIC BOTTLES CRITICAL RESULT CALLED TO, READ BACK BY AND VERIFIED WITH: PHARMD C. SHADE [id number] @1623  FH Performed at [facility], [address], [place], [place] [address]    Culture ESCHERICHIA COLI (A)  Final   Report Status 01/15/2024 FINAL  Final   Organism ID, Bacteria ESCHERICHIA COLI  Final      Susceptibility   Escherichia  coli - MIC*    AMPICILLIN >=32 RESISTANT Resistant     CEFAZOLIN  (NON-URINE) 2 SENSITIVE Sensitive     CEFEPIME  <=0.12 SENSITIVE Sensitive     ERTAPENEM <=0.12 SENSITIVE Sensitive     CEFTRIAXONE  <=0.25 SENSITIVE Sensitive     CIPROFLOXACIN <=0.06 SENSITIVE Sensitive     GENTAMICIN >=16 RESISTANT Resistant     MEROPENEM  <=0.25 SENSITIVE Sensitive     TRIMETH/SULFA >=320 RESISTANT Resistant     AMPICILLIN/SULBACTAM 16 INTERMEDIATE Intermediate     PIP/TAZO Value in next row Sensitive      <=4 SENSITIVEThis is a modified FDA-approved test that has been validated and its performance characteristics determined by the reporting laboratory.  This laboratory is certified under the Clinical Laboratory Improvement Amendments CLIA as qualified to perform high complexity clinical laboratory testing.    * ESCHERICHIA COLI  Blood Culture ID Panel (Reflexed)     Status: Abnormal   Collection Time: 01/13/24  1:45 AM  Result Value Ref Range Status    Enterococcus faecalis NOT DETECTED NOT DETECTED Final   Enterococcus Faecium NOT DETECTED NOT DETECTED Final   Listeria monocytogenes NOT DETECTED NOT DETECTED Final   Staphylococcus species NOT DETECTED NOT DETECTED Final   Staphylococcus aureus (BCID) NOT DETECTED NOT DETECTED Final   Staphylococcus epidermidis NOT DETECTED NOT DETECTED Final   Staphylococcus lugdunensis NOT DETECTED NOT DETECTED Final   Streptococcus species NOT DETECTED NOT DETECTED Final   Streptococcus agalactiae NOT DETECTED NOT DETECTED Final   Streptococcus pneumoniae NOT DETECTED NOT DETECTED Final   Streptococcus pyogenes NOT DETECTED NOT DETECTED Final   A.calcoaceticus-baumannii NOT DETECTED NOT DETECTED Final   Bacteroides fragilis NOT DETECTED NOT DETECTED Final   Enterobacterales DETECTED (A) NOT DETECTED Final    Comment: Enterobacterales represent a large order of gram negative bacteria, not a single organism. CRITICAL RESULT CALLED TO, READ BACK BY AND VERIFIED WITH: PHARMD C. SHADE [id number] @1623  FH    Enterobacter cloacae complex NOT DETECTED NOT DETECTED Final   Escherichia coli DETECTED (A) NOT DETECTED Final    Comment: CRITICAL RESULT CALLED TO, READ BACK BY AND VERIFIED WITH: PHARMD C. SHADE [id number] @1623  FH    Klebsiella aerogenes NOT DETECTED NOT DETECTED Final   Klebsiella oxytoca NOT DETECTED NOT DETECTED Final   Klebsiella pneumoniae NOT DETECTED NOT DETECTED Final   Proteus species NOT DETECTED NOT DETECTED Final   Salmonella species NOT DETECTED NOT DETECTED Final   Serratia marcescens NOT DETECTED NOT DETECTED Final   Haemophilus influenzae NOT DETECTED NOT DETECTED Final   Neisseria meningitidis NOT DETECTED NOT DETECTED Final   Pseudomonas aeruginosa NOT DETECTED NOT DETECTED Final   Stenotrophomonas maltophilia NOT DETECTED NOT DETECTED Final   Candida albicans NOT DETECTED NOT DETECTED Final   Candida auris NOT DETECTED NOT DETECTED Final   Candida glabrata NOT DETECTED NOT  DETECTED Final   Candida krusei NOT DETECTED NOT DETECTED Final   Candida parapsilosis NOT DETECTED NOT DETECTED Final   Candida tropicalis NOT DETECTED NOT DETECTED Final   Cryptococcus neoformans/gattii NOT DETECTED NOT DETECTED Final   CTX-M ESBL NOT DETECTED NOT DETECTED Final   Carbapenem resistance IMP NOT DETECTED NOT DETECTED Final   Carbapenem resistance KPC NOT DETECTED NOT DETECTED Final   Carbapenem resistance NDM NOT DETECTED NOT DETECTED Final   Carbapenem resist OXA 48 LIKE NOT DETECTED NOT DETECTED Final   Carbapenem resistance VIM NOT DETECTED NOT DETECTED Final    Comment: Performed at [facility], [address],  [place], [place] [address]         Radiology Studies: No results found.       Scheduled Meds:  enoxaparin  (LOVENOX ) injection  40 mg Subcutaneous Q24H   estradiol   1 mg Oral QPM   gabapentin   300 mg Oral TID   ondansetron  (ZOFRAN ) IV  4 mg Intravenous Q6H   progesterone   400 mg Oral QHS   senna-docusate  2 tablet Oral BID   thyroid   120 mg Oral Q0600   traZODone   75 mg Oral QHS   valACYclovir   1,000 mg Oral Q12H   Continuous Infusions:  sodium chloride  40 mL/hr at 01/14/24 2234   cefTRIAXone  (ROCEPHIN )  IV 2 g (01/15/24 1015)   promethazine  (PHENERGAN ) injection (IM or IVPB) Stopped (01/15/24 0410)     LOS: 2 days    Time spent: 51 minutes    Renato Applebaum, MD [facility]

## 2024-01-16 DIAGNOSIS — N12 Tubulo-interstitial nephritis, not specified as acute or chronic: Secondary | ICD-10-CM | POA: Diagnosis not present

## 2024-01-16 LAB — URINE CULTURE: Culture: NO GROWTH

## 2024-01-16 MED ORDER — PROMETHAZINE HCL 25 MG PO TABS: 25.0000 mg | ORAL_TABLET | Freq: Four times a day (QID) | ORAL | 0 refills | Status: AC | PRN

## 2024-01-16 MED ORDER — HYDROMORPHONE HCL 1 MG/ML IJ SOLN: 0.5000 mg | INTRAMUSCULAR | Status: DC | PRN

## 2024-01-16 MED ORDER — CEFADROXIL 500 MG PO CAPS: 1000.0000 mg | ORAL_CAPSULE | Freq: Two times a day (BID) | ORAL | 0 refills | Status: AC

## 2024-01-16 MED ORDER — SENNOSIDES-DOCUSATE SODIUM 8.6-50 MG PO TABS: 2.0000 | ORAL_TABLET | Freq: Two times a day (BID) | ORAL | 0 refills | Status: AC

## 2024-01-16 MED ORDER — HYDROMORPHONE HCL 2 MG PO TABS: 2.0000 mg | ORAL_TABLET | Freq: Four times a day (QID) | ORAL | 0 refills | Status: AC | PRN

## 2024-01-16 NOTE — Plan of Care (Signed)
°  Problem: Nutrition: Goal: Adequate nutrition will be maintained Outcome: Progressing   Problem: Health Behavior/Discharge Planning: Goal: Ability to manage health-related needs will improve Outcome: Completed/Met   Problem: Clinical Measurements: Goal: Ability to maintain clinical measurements within normal limits will improve Outcome: Completed/Met Goal: Will remain free from infection Outcome: Completed/Met Goal: Diagnostic test results will improve Outcome: Completed/Met Goal: Respiratory complications will improve Outcome: Completed/Met Goal: Cardiovascular complication will be avoided Outcome: Completed/Met   Problem: Activity: Goal: Risk for activity intolerance will decrease Outcome: Completed/Met   Problem: Coping: Goal: Level of anxiety will decrease Outcome: Completed/Met

## 2024-01-16 NOTE — Discharge Summary (Signed)
 Physician Discharge Summary  [name] FMW:[id number] DOB: 05/08/1970 DOA: 01/12/2024  PCP: System, Provider Not In  Admit date: 01/12/2024 Discharge date: 01/16/2024  Admitted From: Home Disposition: Home  Recommendations for Outpatient Follow-up:  Follow up with PCP in 1-2 weeks Please obtain BMP/CBC in one week   Home Health: N/A Equipment/Devices: N/A  Discharge Condition: Stable CODE STATUS: Full code Diet recommendation: Regular diet as tolerated  Discharge summary: 53 year old with recurrent UTIs, chronic cystitis, urolithiasis, hypothyroidism, right renal cell cancer status post partial nephrectomy, gastric fundoplication, history of hysterectomy presented to the ER with right lower back pain, temperature 103 at home with chills. In the emergency room temperature 99.6, tachycardic with heart rate 140, tachypneic with respiratory rate 22. WC count 24.6. UA was abnormal. CT scan consistent with right parenchymal enhancement suspicious for pyelonephritis. Blood cultures and urine cultures were drawn, patient was given vancomycin  cefepime  and Flagyl .  Admitted to the hospital with IV antibiotics.  Blood cultures with E. coli.  Urine culture unfortunately was sent after 24 hours of antibiotics, no growth so far.  Received high-dose Rocephin  for 4 days.  Clinically stabilized.  Remained in the hospital because of persistent nausea and difficulty tolerating diet.  Going home with oral antibiotics today.   Sepsis present on admission, right-sided pyelonephritis: History of recurrent UTI. Blood cultures, E. coli.  Urine culture, sent after antibiotic administration.  No growth. Rocephin  2 g IV daily x 4 days today.  Able to tolerate oral medications and diet.  As suggested by ID pharmacist, cefadroxil  1 g twice daily for 6 days. CT scan did not show any evidence of hydronephrosis, has evidence of nonobstructive right nephrolithiasis.  Patient will follow-up with her urologist to  discuss if any procedures can be done to prevent recurrent UTI.  Intractable nausea, history of gastric fundoplication with abdominal pain.  Patient does have a history of chronic nausea. Had difficult to control symptoms.  Treated with around-the-clock nausea medications and IV fluids.  Adequately improved now.  Uses Phenergan  and Zofran  at home.  Prescriptions provided. Tolerating diet now.   Hypothyroidism: On Synthroid.     Chronic pain syndrome: On gabapentin , short course of oral Dilaudid  was prescribed as she was not tolerating oxycodone .  Electrolytes: Adequate.   Stabilized to be discharged home with close outpatient follow-up.  Discharge Diagnoses:  Principal Problem:   Pyelonephritis    Discharge Instructions  Discharge Instructions     Diet general   Complete by: As directed    Increase activity slowly   Complete by: As directed       Allergies as of 01/16/2024       Reactions   Tramadol  Other (See Comments)   Severe Headaches Severe Headaches   Adhesive [tape] Rash        Medication List     STOP taking these medications    Fish Oil 1000 MG Caps   Journavx 50 MG Tabs Generic drug: Suzetrigine   metFORMIN 500 MG 24 hr tablet Commonly known as: GLUCOPHAGE-XR   montelukast 10 MG tablet Commonly known as: SINGULAIR   mupirocin ointment 2 % Commonly known as: BACTROBAN   omeprazole 20 MG capsule Commonly known as: PRILOSEC   ondansetron  8 MG tablet Commonly known as: ZOFRAN    oxyCODONE -acetaminophen  5-325 MG tablet Commonly known as: Percocet   SUPER GREENS PO   tamsulosin 0.4 MG Caps capsule Commonly known as: FLOMAX       TAKE these medications    albuterol 108 (90  Base) MCG/ACT inhaler Commonly known as: VENTOLIN HFA Inhale 1-2 puffs into the lungs every 6 (six) hours as needed for wheezing or shortness of breath.   Allegra-D Allergy & Congestion 60-120 MG 12 hr tablet Generic drug: fexofenadine-pseudoephedrine Take 1  tablet by mouth daily as needed (Allergies).   alum & mag hydroxide-simeth 200-200-20 MG/5ML suspension Commonly known as: MAALOX/MYLANTA Take 30 mLs by mouth every 4 (four) hours as needed for indigestion.   ASHWAGANDHA PO Take 1 g by mouth daily.   Azelastine HCl 137 MCG/SPRAY Soln Place 1 spray into both nostrils 2 (two) times daily.   BLACK CURRANT SEED OIL PO Take 1,000 mg by mouth daily.   cefadroxil  500 MG capsule Commonly known as: DURICEF Take 2 capsules (1,000 mg total) by mouth 2 (two) times daily for 6 days.   cyanocobalamin  1000 MCG tablet Commonly known as: VITAMIN B12 Take 1,000 mcg by mouth daily.   DIGESTIVE HEALTH PROBIOTIC PO Take 1 capsule by mouth daily.   esomeprazole 20 MG capsule Commonly known as: NEXIUM Take 20 mg by mouth every evening.   estradiol  1 MG tablet Commonly known as: ESTRACE  Take 1 mg by mouth every evening.   EVENING PRIMROSE OIL PO Take 1,300 mg by mouth daily.   gabapentin  300 MG capsule Commonly known as: NEURONTIN  Take 300 mg by mouth 3 (three) times daily.   HAIR/SKIN/NAILS PO Take 2 tablets by mouth daily.   HYDROmorphone  2 MG tablet Commonly known as: Dilaudid  Take 1 tablet (2 mg total) by mouth every 6 (six) hours as needed for up to 5 days for severe pain (pain score 7-10).   Lidocaine -Hydrocortisone Ace 3-0.5 % Crea Place 1 application rectally as needed (discomfort/hemmorroids.).   linaclotide 145 MCG Caps capsule Commonly known as: LINZESS Take 145 mcg by mouth daily before breakfast.   MAGNESIUM  PO Take 210 mg by mouth at bedtime.   NP Thyroid  120 MG tablet Generic drug: thyroid  Take 120 mg by mouth daily before breakfast.   ondansetron  8 MG disintegrating tablet Commonly known as: ZOFRAN -ODT Take 1 tablet (8 mg total) by mouth every 8 (eight) hours as needed.   progesterone  200 MG capsule Commonly known as: PROMETRIUM  Take 400 mg by mouth at bedtime.   promethazine  25 MG tablet Commonly known  as: PHENERGAN  Take 1 tablet (25 mg total) by mouth every 6 (six) hours as needed for nausea or vomiting.   senna-docusate 8.6-50 MG tablet Commonly known as: Senokot-S Take 2 tablets by mouth 2 (two) times daily.   spironolactone  100 MG tablet Commonly known as: ALDACTONE  Take 100 mg by mouth daily.   tiZANidine  4 MG tablet Commonly known as: ZANAFLEX  Take 4 mg by mouth daily as needed.   traZODone  150 MG tablet Commonly known as: DESYREL  Take 75 mg by mouth at bedtime.   TURMERIC PO Take 1,000 mg by mouth daily.   valACYclovir  1000 MG tablet Commonly known as: VALTREX  Take 1,000 mg by mouth 2 (two) times daily as needed (outbreaks).   vitamin C 1000 MG tablet Take 1,000 mg by mouth daily.   Vitamin D3 125 MCG (5000 UT) Tabs Take 5,000 Units by mouth daily.   Zinc 50 MG Tabs Take 50 mg by mouth as needed.        Allergies[1]  Consultations: None   Procedures/Studies: CT ABDOMEN PELVIS W CONTRAST Result Date: 01/13/2024 EXAM: CT ABDOMEN AND PELVIS WITH CONTRAST 01/13/2024 01:34:25 AM TECHNIQUE: CT of the abdomen and pelvis was performed with the administration  of 100 mL of iohexol  (OMNIPAQUE ) 300 MG/ML solution. Multiplanar reformatted images are provided for review. Automated exposure control, iterative reconstruction, and/or weight-based adjustment of the mA/kV was utilized to reduce the radiation dose to as low as reasonably achievable. COMPARISON: 06/12/2019 CLINICAL HISTORY: Back pain and fevers for 1 week. FINDINGS: LOWER CHEST: Lung bases are clear. LIVER: The liver is unremarkable. GALLBLADDER AND BILE DUCTS: Gallbladder has been surgically removed. No biliary ductal dilatation. SPLEEN: No acute abnormality. PANCREAS: No acute abnormality. ADRENAL GLANDS: No acute abnormality. KIDNEYS, URETERS AND BLADDER: The left kidney demonstrates a normal enhancement pattern. The right kidney demonstrates patchy decreased enhancement throughout. Diffuse scattered  calcifications are noted, consistent with nonobstructing calculi. No obstructive changes are seen. The bladder is within normal limits. GI AND BOWEL: Stomach demonstrates no acute abnormality. Small bowel is unremarkable. Colon is within normal limits. The appendix is not well visualized, consistent with the prior surgical history. There is no bowel obstruction. PERITONEUM AND RETROPERITONEUM: No ascites. No free air. VASCULATURE: Aorta is normal in caliber. LYMPH NODES: No lymphadenopathy. REPRODUCTIVE ORGANS: The uterus has been surgically removed. BONES AND SOFT TISSUES: No acute osseous abnormality. No focal soft tissue abnormality. IMPRESSION: 1. Patchy decreased enhancement throughout the right kidney, suspicious for pyelonephritis. Correlate with urinalysis and consider clinical treatment and follow-up as indicated. 2. Nonobstructing right nephrolithiasis. Electronically signed by: Oneil Devonshire MD 01/13/2024 01:52 AM EST RP Workstation: [id number]   DG Chest Port 1 View Result Date: 01/13/2024 EXAM: 1 VIEW XRAY OF THE CHEST 01/13/2024 12:17:00 AM COMPARISON: None available. CLINICAL HISTORY: Questionable sepsis - evaluate for abnormality FINDINGS: LUNGS AND PLEURA: No focal pulmonary opacity. No pleural effusion. No pneumothorax. HEART AND MEDIASTINUM: No acute abnormality of the cardiac and mediastinal silhouettes. BONES AND SOFT TISSUES: Mild thoracic scoliosis. IMPRESSION: 1. No acute cardiopulmonary abnormality. 2. Mild thoracic scoliosis. Electronically signed by: Oneil Devonshire MD 01/13/2024 12:20 AM EST RP Workstation: [id number]   (Echo, Carotid, EGD, Colonoscopy, ERCP)    Subjective: Patient seen and examined.  Husband on the speaker phone.  Patient told me that she is able to eat half of the breakfast.  Patient tells me that her nausea is at about her baseline.  She is always nauseated even at home.  She can manage this with under the tongue Zofran  and Phenergan  and wanting some prescriptions.   She gets woozy with oxycodone  and wants to try some oral Dilaudid  prescriptions.   Discharge Exam: Vitals:   01/16/24 0628 01/16/24 0842  BP: 118/67 130/71  Pulse: (!) 120 (!) 110  Resp: 18 16  Temp: 98.7 F (37.1 C) 98.9 F (37.2 C)  SpO2: 95% 92%   Vitals:   01/15/24 1329 01/15/24 2136 01/16/24 0628 01/16/24 0842  BP: 138/79 135/84 118/67 130/71  Pulse: (!) 106 (!) 109 (!) 120 (!) 110  Resp: 18 18 18 16   Temp: (!) 97.4 F (36.3 C) 98.1 F (36.7 C) 98.7 F (37.1 C) 98.9 F (37.2 C)  TempSrc: Oral     SpO2:  94% 95% 92%  Weight:      Height:        General: Pt is alert, awake, not in acute distress Pleasant and interactive. Cardiovascular: RRR, S1/S2 +, no rubs, no gallops Respiratory: CTA bilaterally, no wheezing, no rhonchi Abdominal: Soft, NT, ND, bowel sounds + Extremities: no edema, no cyanosis    The results of significant diagnostics from this hospitalization (including imaging, microbiology, ancillary and laboratory) are listed below for reference.  Microbiology: Recent Results (from the past 240 hours)  Resp panel by RT-PCR (RSV, Flu A&B, Covid) Anterior Nasal Swab     Status: None   Collection Time: 01/13/24 12:32 AM   Specimen: Anterior Nasal Swab  Result Value Ref Range Status   SARS Coronavirus 2 by RT PCR NEGATIVE NEGATIVE Final    Comment: (NOTE) SARS-CoV-2 target nucleic acids are NOT DETECTED.  The SARS-CoV-2 RNA is generally detectable in upper respiratory specimens during the acute phase of infection. The lowest concentration of SARS-CoV-2 viral copies this assay can detect is 138 copies/mL. A negative result does not preclude SARS-Cov-2 infection and should not be used as the sole basis for treatment or other patient management decisions. A negative result may occur with  improper specimen collection/handling, submission of specimen other than nasopharyngeal swab, presence of viral mutation(s) within the areas targeted by this assay,  and inadequate number of viral copies(<138 copies/mL). A negative result must be combined with clinical observations, patient history, and epidemiological information. The expected result is Negative.  Fact Sheet for Patients:  bloggercourse.com  Fact Sheet for Healthcare Providers:  seriousbroker.it  This test is no t yet approved or cleared by the [place]  FDA and  has been authorized for detection and/or diagnosis of SARS-CoV-2 by FDA under an Emergency Use Authorization (EUA). This EUA will remain  in effect (meaning this test can be used) for the duration of the COVID-19 declaration under Section 564(b)(1) of the Act, 21 U.S.C.section 360bbb-3(b)(1), unless the authorization is terminated  or revoked sooner.       Influenza A by PCR NEGATIVE NEGATIVE Final   Influenza B by PCR NEGATIVE NEGATIVE Final    Comment: (NOTE) The Xpert Xpress SARS-CoV-2/FLU/RSV plus assay is intended as an aid in the diagnosis of influenza from Nasopharyngeal swab specimens and should not be used as a sole basis for treatment. Nasal washings and aspirates are unacceptable for Xpert Xpress SARS-CoV-2/FLU/RSV testing.  Fact Sheet for Patients: bloggercourse.com  Fact Sheet for Healthcare Providers: seriousbroker.it  This test is not yet approved or cleared by the [place]  FDA and has been authorized for detection and/or diagnosis of SARS-CoV-2 by FDA under an Emergency Use Authorization (EUA). This EUA will remain in effect (meaning this test can be used) for the duration of the COVID-19 declaration under Section 564(b)(1) of the Act, 21 U.S.C. section 360bbb-3(b)(1), unless the authorization is terminated or revoked.     Resp Syncytial Virus by PCR NEGATIVE NEGATIVE Final    Comment: (NOTE) Fact Sheet for Patients: bloggercourse.com  Fact Sheet for  Healthcare Providers: seriousbroker.it  This test is not yet approved or cleared by the [place]  FDA and has been authorized for detection and/or diagnosis of SARS-CoV-2 by FDA under an Emergency Use Authorization (EUA). This EUA will remain in effect (meaning this test can be used) for the duration of the COVID-19 declaration under Section 564(b)(1) of the Act, 21 U.S.C. section 360bbb-3(b)(1), unless the authorization is terminated or revoked.  Performed at [facility], [address], [place], [place] [address]   Blood Culture (routine x 2)     Status: Abnormal   Collection Time: 01/13/24  1:30 AM   Specimen: BLOOD LEFT ARM  Result Value Ref Range Status   Specimen Description   Final    BLOOD LEFT ARM Performed at [facility], [address] [place], [place] [address]    Special Requests   Final    BOTTLES DRAWN AEROBIC  AND ANAEROBIC Blood Culture results may not be optimal due to an inadequate volume of blood received in culture bottles Performed at [facility], [address], [place], [place] [address]    Culture  Setup Time   Final    GRAM NEGATIVE RODS IN BOTH AEROBIC AND ANAEROBIC BOTTLES CRITICAL RESULT CALLED TO, READ BACK BY AND VERIFIED WITH: PHARMD C. SHADE [id number] @1623  FH    Culture (A)  Final    ESCHERICHIA COLI SUSCEPTIBILITIES PERFORMED ON PREVIOUS CULTURE WITHIN THE LAST 5 DAYS. Performed at [facility], [address] [place], [place] [address]    Report Status 01/15/2024 FINAL  Final  Blood Culture (routine x 2)     Status: Abnormal   Collection Time: 01/13/24  1:45 AM   Specimen: BLOOD LEFT ARM  Result Value Ref Range Status   Specimen Description   Final    BLOOD LEFT ARM Performed at [facility], [address], [place], [place] [address]    Special Requests   Final    BOTTLES DRAWN AEROBIC AND ANAEROBIC Blood Culture results may not be optimal due to an  inadequate volume of blood received in culture bottles Performed at [facility], [address], [place], [place] [address]    Culture  Setup Time   Final    GRAM NEGATIVE RODS IN BOTH AEROBIC AND ANAEROBIC BOTTLES CRITICAL RESULT CALLED TO, READ BACK BY AND VERIFIED WITH: PHARMD C. SHADE [id number] @1623  FH Performed at [facility], [address] [place], [place] [address]    Culture ESCHERICHIA COLI (A)  Final   Report Status 01/15/2024 FINAL  Final   Organism ID, Bacteria ESCHERICHIA COLI  Final      Susceptibility   Escherichia coli - MIC*    AMPICILLIN >=32 RESISTANT Resistant     CEFAZOLIN  (NON-URINE) 2 SENSITIVE Sensitive     CEFEPIME  <=0.12 SENSITIVE Sensitive     ERTAPENEM <=0.12 SENSITIVE Sensitive     CEFTRIAXONE  <=0.25 SENSITIVE Sensitive     CIPROFLOXACIN <=0.06 SENSITIVE Sensitive     GENTAMICIN >=16 RESISTANT Resistant     MEROPENEM  <=0.25 SENSITIVE Sensitive     TRIMETH/SULFA >=320 RESISTANT Resistant     AMPICILLIN/SULBACTAM 16 INTERMEDIATE Intermediate     PIP/TAZO Value in next row Sensitive      <=4 SENSITIVEThis is a modified FDA-approved test that has been validated and its performance characteristics determined by the reporting laboratory.  This laboratory is certified under the Clinical Laboratory Improvement Amendments CLIA as qualified to perform high complexity clinical laboratory testing.    * ESCHERICHIA COLI  Blood Culture ID Panel (Reflexed)     Status: Abnormal   Collection Time: 01/13/24  1:45 AM  Result Value Ref Range Status   Enterococcus faecalis NOT DETECTED NOT DETECTED Final   Enterococcus Faecium NOT DETECTED NOT DETECTED Final   Listeria monocytogenes NOT DETECTED NOT DETECTED Final   Staphylococcus species NOT DETECTED NOT DETECTED Final   Staphylococcus aureus (BCID) NOT DETECTED NOT DETECTED Final   Staphylococcus epidermidis NOT DETECTED NOT DETECTED Final   Staphylococcus lugdunensis NOT DETECTED NOT DETECTED  Final   Streptococcus species NOT DETECTED NOT DETECTED Final   Streptococcus agalactiae NOT DETECTED NOT DETECTED Final   Streptococcus pneumoniae NOT DETECTED NOT DETECTED Final   Streptococcus pyogenes NOT DETECTED NOT DETECTED Final   A.calcoaceticus-baumannii NOT DETECTED NOT DETECTED Final   Bacteroides fragilis NOT DETECTED NOT DETECTED Final   Enterobacterales DETECTED (  A) NOT DETECTED Final    Comment: Enterobacterales represent a large order of gram negative bacteria, not a single organism. CRITICAL RESULT CALLED TO, READ BACK BY AND VERIFIED WITH: PHARMD C. SHADE [id number] @1623  FH    Enterobacter cloacae complex NOT DETECTED NOT DETECTED Final   Escherichia coli DETECTED (A) NOT DETECTED Final    Comment: CRITICAL RESULT CALLED TO, READ BACK BY AND VERIFIED WITH: PHARMD C. SHADE [id number] @1623  FH    Klebsiella aerogenes NOT DETECTED NOT DETECTED Final   Klebsiella oxytoca NOT DETECTED NOT DETECTED Final   Klebsiella pneumoniae NOT DETECTED NOT DETECTED Final   Proteus species NOT DETECTED NOT DETECTED Final   Salmonella species NOT DETECTED NOT DETECTED Final   Serratia marcescens NOT DETECTED NOT DETECTED Final   Haemophilus influenzae NOT DETECTED NOT DETECTED Final   Neisseria meningitidis NOT DETECTED NOT DETECTED Final   Pseudomonas aeruginosa NOT DETECTED NOT DETECTED Final   Stenotrophomonas maltophilia NOT DETECTED NOT DETECTED Final   Candida albicans NOT DETECTED NOT DETECTED Final   Candida auris NOT DETECTED NOT DETECTED Final   Candida glabrata NOT DETECTED NOT DETECTED Final   Candida krusei NOT DETECTED NOT DETECTED Final   Candida parapsilosis NOT DETECTED NOT DETECTED Final   Candida tropicalis NOT DETECTED NOT DETECTED Final   Cryptococcus neoformans/gattii NOT DETECTED NOT DETECTED Final   CTX-M ESBL NOT DETECTED NOT DETECTED Final   Carbapenem resistance IMP NOT DETECTED NOT DETECTED Final   Carbapenem resistance KPC NOT DETECTED NOT DETECTED Final    Carbapenem resistance NDM NOT DETECTED NOT DETECTED Final   Carbapenem resist OXA 48 LIKE NOT DETECTED NOT DETECTED Final   Carbapenem resistance VIM NOT DETECTED NOT DETECTED Final    Comment: Performed at [facility], [address] [place], [place] [address]  Urine Culture (for pregnant, neutropenic or urologic patients or patients with an indwelling urinary catheter)     Status: None   Collection Time: 01/13/24  5:11 AM   Specimen: Urine, Clean Catch  Result Value Ref Range Status   Specimen Description   Final    URINE, CLEAN CATCH Performed at [facility], [address], [place], [place] [address]    Special Requests   Final    NONE Performed at [facility], [address], [place], [place] [address]    Culture   Final    NO GROWTH Performed at [facility], [address], [place], [place] [address]    Report Status 01/16/2024 FINAL  Final     Labs: BNP (last 3 results) No results for input(s): BNP in the last 8760 hours. Basic Metabolic Panel: Recent Labs  Lab 01/13/24 0020 01/13/24 0511 01/14/24 0600  NA 133* 132* 133*  K 4.3 3.8 4.5  CL 96* 99 99  CO2 20* 20* 22  GLUCOSE 161* 199* 105*  BUN 20 17 12   CREATININE 0.87 0.86 0.68  CALCIUM 10.8* 9.1 9.1  MG  --  1.2* 2.0  PHOS  --  2.2* 2.1*   Liver Function Tests: Recent Labs  Lab 01/13/24 0020  AST 16  ALT 12  ALKPHOS 102  BILITOT 0.8  PROT 7.8  ALBUMIN 4.2   Recent Labs  Lab 01/13/24 0020  LIPASE 18   No results for input(s): AMMONIA in the last 168 hours. CBC: Recent Labs  Lab 01/13/24 0020 01/13/24 0511 01/14/24 0600 01/15/24 0710  WBC 24.6* 13.4* 17.5* 12.3*  NEUTROABS 21.9*  --  15.3* 9.9*  HGB 13.4 11.2* 11.3* 10.5*  HCT 39.7 33.7* 34.1* 31.6*  MCV 99.0 101.2* 101.2* 99.1  PLT 299 221 215 240   Cardiac Enzymes: No results for input(s): CKTOTAL, CKMB, CKMBINDEX, TROPONINI in the last 168 hours. BNP: Invalid  input(s): POCBNP CBG: No results for input(s): GLUCAP in the last 168 hours. D-Dimer No results for input(s): DDIMER in the last 72 hours. Hgb A1c No results for input(s): HGBA1C in the last 72 hours. Lipid Profile No results for input(s): CHOL, HDL, LDLCALC, TRIG, CHOLHDL, LDLDIRECT in the last 72 hours. Thyroid  function studies No results for input(s): TSH, T4TOTAL, T3FREE, THYROIDAB in the last 72 hours.  Invalid input(s): FREET3 Anemia work up No results for input(s): VITAMINB12, FOLATE, FERRITIN, TIBC, IRON, RETICCTPCT in the last 72 hours. Urinalysis    Component Value Date/Time   COLORURINE YELLOW 01/13/2024 0511   APPEARANCEUR CLEAR 01/13/2024 0511   LABSPEC 1.036 (H) 01/13/2024 0511   PHURINE 6.0 01/13/2024 0511   GLUCOSEU 150 (A) 01/13/2024 0511   HGBUR NEGATIVE 01/13/2024 0511   BILIRUBINUR NEGATIVE 01/13/2024 0511   KETONESUR 20 (A) 01/13/2024 0511   PROTEINUR 100 (A) 01/13/2024 0511   NITRITE NEGATIVE 01/13/2024 0511   LEUKOCYTESUR NEGATIVE 01/13/2024 0511   Sepsis Labs Recent Labs  Lab 01/13/24 0020 01/13/24 0511 01/14/24 0600 01/15/24 0710  WBC 24.6* 13.4* 17.5* 12.3*   Microbiology Recent Results (from the past 240 hours)  Resp panel by RT-PCR (RSV, Flu A&B, Covid) Anterior Nasal Swab     Status: None   Collection Time: 01/13/24 12:32 AM   Specimen: Anterior Nasal Swab  Result Value Ref Range Status   SARS Coronavirus 2 by RT PCR NEGATIVE NEGATIVE Final    Comment: (NOTE) SARS-CoV-2 target nucleic acids are NOT DETECTED.  The SARS-CoV-2 RNA is generally detectable in upper respiratory specimens during the acute phase of infection. The lowest concentration of SARS-CoV-2 viral copies this assay can detect is 138 copies/mL. A negative result does not preclude SARS-Cov-2 infection and should not be used as the sole basis for treatment or other patient management decisions. A negative result may occur with   improper specimen collection/handling, submission of specimen other than nasopharyngeal swab, presence of viral mutation(s) within the areas targeted by this assay, and inadequate number of viral copies(<138 copies/mL). A negative result must be combined with clinical observations, patient history, and epidemiological information. The expected result is Negative.  Fact Sheet for Patients:  bloggercourse.com  Fact Sheet for Healthcare Providers:  seriousbroker.it  This test is no t yet approved or cleared by the [place]  FDA and  has been authorized for detection and/or diagnosis of SARS-CoV-2 by FDA under an Emergency Use Authorization (EUA). This EUA will remain  in effect (meaning this test can be used) for the duration of the COVID-19 declaration under Section 564(b)(1) of the Act, 21 U.S.C.section 360bbb-3(b)(1), unless the authorization is terminated  or revoked sooner.       Influenza A by PCR NEGATIVE NEGATIVE Final   Influenza B by PCR NEGATIVE NEGATIVE Final    Comment: (NOTE) The Xpert Xpress SARS-CoV-2/FLU/RSV plus assay is intended as an aid in the diagnosis of influenza from Nasopharyngeal swab specimens and should not be used as a sole basis for treatment. Nasal washings and aspirates are unacceptable for Xpert Xpress SARS-CoV-2/FLU/RSV testing.  Fact Sheet for Patients: bloggercourse.com  Fact Sheet for Healthcare Providers: seriousbroker.it  This test is not yet approved or cleared by the [place]  FDA and  has been authorized for detection and/or diagnosis of SARS-CoV-2 by FDA under an Emergency Use Authorization (EUA). This EUA will remain in effect (meaning this test can be used) for the duration of the COVID-19 declaration under Section 564(b)(1) of the Act, 21 U.S.C. section 360bbb-3(b)(1), unless the authorization is terminated or revoked.      Resp Syncytial Virus by PCR NEGATIVE NEGATIVE Final    Comment: (NOTE) Fact Sheet for Patients: bloggercourse.com  Fact Sheet for Healthcare Providers: seriousbroker.it  This test is not yet approved or cleared by the [place]  FDA and has been authorized for detection and/or diagnosis of SARS-CoV-2 by FDA under an Emergency Use Authorization (EUA). This EUA will remain in effect (meaning this test can be used) for the duration of the COVID-19 declaration under Section 564(b)(1) of the Act, 21 U.S.C. section 360bbb-3(b)(1), unless the authorization is terminated or revoked.  Performed at [facility], [address], [place], [place] [address]   Blood Culture (routine x 2)     Status: Abnormal   Collection Time: 01/13/24  1:30 AM   Specimen: BLOOD LEFT ARM  Result Value Ref Range Status   Specimen Description   Final    BLOOD LEFT ARM Performed at [facility], [address] [place], [place] [address]    Special Requests   Final    BOTTLES DRAWN AEROBIC AND ANAEROBIC Blood Culture results may not be optimal due to an inadequate volume of blood received in culture bottles Performed at [facility], [address] [place], [place] [address]    Culture  Setup Time   Final    GRAM NEGATIVE RODS IN BOTH AEROBIC AND ANAEROBIC BOTTLES CRITICAL RESULT CALLED TO, READ BACK BY AND VERIFIED WITH: PHARMD C. SHADE [id number] @1623  FH    Culture (A)  Final    ESCHERICHIA COLI SUSCEPTIBILITIES PERFORMED ON PREVIOUS CULTURE WITHIN THE LAST 5 DAYS. Performed at [facility], [address], [place], [place] [address]    Report Status 01/15/2024 FINAL  Final  Blood Culture (routine x 2)     Status: Abnormal   Collection Time: 01/13/24  1:45 AM   Specimen: BLOOD LEFT ARM  Result Value Ref Range Status   Specimen Description   Final    BLOOD LEFT ARM Performed at [facility], [address] [place], [place] [address]    Special Requests   Final    BOTTLES DRAWN AEROBIC AND ANAEROBIC Blood Culture results may not be optimal due to an inadequate volume of blood received in culture bottles Performed at [facility], [address] [place], [place] [address]    Culture  Setup Time   Final    GRAM NEGATIVE RODS IN BOTH AEROBIC AND ANAEROBIC BOTTLES CRITICAL RESULT CALLED TO, READ BACK BY AND VERIFIED WITH: PHARMD C. SHADE [id number] @1623  FH Performed at [facility], [address] [place], [place] [address]    Culture ESCHERICHIA COLI (A)  Final   Report Status 01/15/2024 FINAL  Final   Organism ID, Bacteria ESCHERICHIA COLI  Final      Susceptibility   Escherichia coli - MIC*    AMPICILLIN >=32 RESISTANT Resistant     CEFAZOLIN  (NON-URINE) 2 SENSITIVE Sensitive     CEFEPIME  <=0.12 SENSITIVE Sensitive     ERTAPENEM <=0.12 SENSITIVE Sensitive     CEFTRIAXONE  <=0.25 SENSITIVE Sensitive     CIPROFLOXACIN <=0.06 SENSITIVE Sensitive  GENTAMICIN >=16 RESISTANT Resistant     MEROPENEM  <=0.25 SENSITIVE Sensitive     TRIMETH/SULFA >=320 RESISTANT Resistant     AMPICILLIN/SULBACTAM 16 INTERMEDIATE Intermediate     PIP/TAZO Value in next row Sensitive      <=4 SENSITIVEThis is a modified FDA-approved test that has been validated and its performance characteristics determined by the reporting laboratory.  This laboratory is certified under the Clinical Laboratory Improvement Amendments CLIA as qualified to perform high complexity clinical laboratory testing.    * ESCHERICHIA COLI  Blood Culture ID Panel (Reflexed)     Status: Abnormal   Collection Time: 01/13/24  1:45 AM  Result Value Ref Range Status   Enterococcus faecalis NOT DETECTED NOT DETECTED Final   Enterococcus Faecium NOT DETECTED NOT DETECTED Final   Listeria monocytogenes NOT DETECTED NOT DETECTED Final   Staphylococcus species NOT DETECTED NOT DETECTED Final   Staphylococcus  aureus (BCID) NOT DETECTED NOT DETECTED Final   Staphylococcus epidermidis NOT DETECTED NOT DETECTED Final   Staphylococcus lugdunensis NOT DETECTED NOT DETECTED Final   Streptococcus species NOT DETECTED NOT DETECTED Final   Streptococcus agalactiae NOT DETECTED NOT DETECTED Final   Streptococcus pneumoniae NOT DETECTED NOT DETECTED Final   Streptococcus pyogenes NOT DETECTED NOT DETECTED Final   A.calcoaceticus-baumannii NOT DETECTED NOT DETECTED Final   Bacteroides fragilis NOT DETECTED NOT DETECTED Final   Enterobacterales DETECTED (A) NOT DETECTED Final    Comment: Enterobacterales represent a large order of gram negative bacteria, not a single organism. CRITICAL RESULT CALLED TO, READ BACK BY AND VERIFIED WITH: PHARMD C. SHADE [id number] @1623  FH    Enterobacter cloacae complex NOT DETECTED NOT DETECTED Final   Escherichia coli DETECTED (A) NOT DETECTED Final    Comment: CRITICAL RESULT CALLED TO, READ BACK BY AND VERIFIED WITH: PHARMD C. SHADE [id number] @1623  FH    Klebsiella aerogenes NOT DETECTED NOT DETECTED Final   Klebsiella oxytoca NOT DETECTED NOT DETECTED Final   Klebsiella pneumoniae NOT DETECTED NOT DETECTED Final   Proteus species NOT DETECTED NOT DETECTED Final   Salmonella species NOT DETECTED NOT DETECTED Final   Serratia marcescens NOT DETECTED NOT DETECTED Final   Haemophilus influenzae NOT DETECTED NOT DETECTED Final   Neisseria meningitidis NOT DETECTED NOT DETECTED Final   Pseudomonas aeruginosa NOT DETECTED NOT DETECTED Final   Stenotrophomonas maltophilia NOT DETECTED NOT DETECTED Final   Candida albicans NOT DETECTED NOT DETECTED Final   Candida auris NOT DETECTED NOT DETECTED Final   Candida glabrata NOT DETECTED NOT DETECTED Final   Candida krusei NOT DETECTED NOT DETECTED Final   Candida parapsilosis NOT DETECTED NOT DETECTED Final   Candida tropicalis NOT DETECTED NOT DETECTED Final   Cryptococcus neoformans/gattii NOT DETECTED NOT DETECTED Final   CTX-M  ESBL NOT DETECTED NOT DETECTED Final   Carbapenem resistance IMP NOT DETECTED NOT DETECTED Final   Carbapenem resistance KPC NOT DETECTED NOT DETECTED Final   Carbapenem resistance NDM NOT DETECTED NOT DETECTED Final   Carbapenem resist OXA 48 LIKE NOT DETECTED NOT DETECTED Final   Carbapenem resistance VIM NOT DETECTED NOT DETECTED Final    Comment: Performed at [facility], [address] [place], [place] [address]  Urine Culture (for pregnant, neutropenic or urologic patients or patients with an indwelling urinary catheter)     Status: None   Collection Time: 01/13/24  5:11 AM   Specimen: Urine, Clean Catch  Result Value Ref Range Status   Specimen Description   Final  URINE, CLEAN CATCH Performed at [facility], [address], [place], [place] [address]    Special Requests   Final    NONE Performed at [facility], [address] [place], [place] [address]    Culture   Final    NO GROWTH Performed at [facility], [address] [place], [place] [address]    Report Status 01/16/2024 FINAL  Final     Time coordinating discharge: 35 minutes  SIGNED:   Renato Applebaum, MD  [facility] 01/16/2024, 1:01 PM     [1]  Allergies Allergen Reactions   Tramadol  Other (See Comments)    Severe Headaches Severe Headaches    Adhesive [Tape] Rash

## 2024-01-16 NOTE — Progress Notes (Signed)
°   01/16/24 0841  TOC Brief Assessment  Insurance and Status Reviewed  Patient has primary care physician No (none listed will follow up)  Home environment has been reviewed home  Prior level of function: Independent  Prior/Current Home Services No current home services  Social Drivers of Health Review SDOH reviewed no interventions necessary  Readmission risk has been reviewed Yes  Transition of care needs no transition of care needs at this time   No PCP listed will follow up

## 2024-01-16 NOTE — TOC Transition Note (Signed)
 Transition of Care [facility]) - Discharge Note   Patient Details  Name: [name] MRN: [id number] Date of Birth: 1970/04/12  [facility] CM/SW Contact:  Toy LITTIE Agar, RN Phone Number:[phone]  01/16/2024, 11:37 AM   Clinical Narrative:    Patient discharging with no inpatient care manager needs.    Final next level of care: Home/Self Care Barriers to Discharge: No Barriers Identified   Patient Goals and CMS Choice Patient states their goals for this hospitalization and ongoing recovery are:: Ready to go home   Choice offered to / list presented to : NA      Discharge Placement                       Discharge Plan and Services Additional resources added to the After Visit Summary for                  DME Arranged: N/A DME Agency: NA       HH Arranged: NA HH Agency: NA        Social Drivers of Health (SDOH) Interventions SDOH Screenings   Food Insecurity: No Food Insecurity (01/14/2024)  Housing: Low Risk (01/14/2024)  Transportation Needs: No Transportation Needs (01/14/2024)  Utilities: Not At Risk (01/14/2024)  Social Connections: Unknown (06/06/2021)   Received from [facility]  Tobacco Use: Low Risk (01/12/2024)     Readmission Risk Interventions     No data to display

## 2024-01-16 NOTE — Plan of Care (Signed)

## 2024-01-16 NOTE — Progress Notes (Signed)
 Pt discharged home with spouse in stable condition. DC instructions given. Scripts sent to pharmacy of choice. No immediate questions or concerns at this time. Pt to discharge from unit via wheelchair.
